# Patient Record
Sex: Female | Born: 1985 | Race: White | Hispanic: No | Marital: Single | Smoking: Current every day smoker
Health system: Southern US, Community
[De-identification: ages and names within clinical notes are randomized; demographics above are authoritative.]

## PROBLEM LIST (undated history)

## (undated) DIAGNOSIS — F319 Bipolar disorder, unspecified: Secondary | ICD-10-CM

## (undated) DIAGNOSIS — F1910 Other psychoactive substance abuse, uncomplicated: Secondary | ICD-10-CM

## (undated) DIAGNOSIS — Z3490 Encounter for supervision of normal pregnancy, unspecified, unspecified trimester: Secondary | ICD-10-CM

## (undated) DIAGNOSIS — F329 Major depressive disorder, single episode, unspecified: Secondary | ICD-10-CM

## (undated) DIAGNOSIS — F419 Anxiety disorder, unspecified: Secondary | ICD-10-CM

## (undated) DIAGNOSIS — F32A Depression, unspecified: Secondary | ICD-10-CM

---
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
## 2007-04-02 ENCOUNTER — Emergency Department: Payer: Self-pay | Admitting: Emergency Medicine

## 2008-01-09 ENCOUNTER — Emergency Department (HOSPITAL_COMMUNITY): Admission: EM | Admit: 2008-01-09 | Discharge: 2008-01-09 | Payer: Self-pay | Admitting: Emergency Medicine

## 2009-02-23 ENCOUNTER — Emergency Department: Payer: Self-pay | Admitting: Emergency Medicine

## 2009-05-09 ENCOUNTER — Emergency Department: Payer: Self-pay | Admitting: Emergency Medicine

## 2009-05-12 ENCOUNTER — Emergency Department: Payer: Self-pay | Admitting: Emergency Medicine

## 2009-10-14 ENCOUNTER — Emergency Department: Payer: Self-pay | Admitting: Internal Medicine

## 2009-11-09 ENCOUNTER — Emergency Department: Payer: Self-pay | Admitting: Emergency Medicine

## 2009-11-23 ENCOUNTER — Emergency Department: Payer: Self-pay | Admitting: Emergency Medicine

## 2009-12-12 ENCOUNTER — Emergency Department: Payer: Self-pay | Admitting: Emergency Medicine

## 2009-12-22 ENCOUNTER — Emergency Department: Payer: Self-pay | Admitting: Emergency Medicine

## 2010-04-08 ENCOUNTER — Observation Stay: Payer: Self-pay | Admitting: Obstetrics and Gynecology

## 2010-04-30 ENCOUNTER — Observation Stay: Payer: Self-pay | Admitting: Obstetrics and Gynecology

## 2010-05-19 ENCOUNTER — Observation Stay: Payer: Self-pay | Admitting: Advanced Practice Midwife

## 2010-06-17 ENCOUNTER — Inpatient Hospital Stay: Payer: Self-pay

## 2011-01-17 LAB — POCT PREGNANCY, URINE: Preg Test, Ur: NEGATIVE

## 2012-01-23 ENCOUNTER — Inpatient Hospital Stay: Payer: Self-pay | Admitting: Obstetrics and Gynecology

## 2012-01-23 LAB — CBC
MCV: 91 fL (ref 80–100)
Platelet: 163 10*3/uL (ref 150–440)
RDW: 11.6 % (ref 11.5–14.5)
WBC: 8 10*3/uL (ref 3.6–11.0)

## 2012-01-23 LAB — URINALYSIS, COMPLETE
Blood: NEGATIVE
Ketone: NEGATIVE
Ph: 6 (ref 4.5–8.0)
Protein: NEGATIVE
Specific Gravity: 1.02 (ref 1.003–1.030)
Squamous Epithelial: 1

## 2012-01-23 LAB — HCG, QUANTITATIVE, PREGNANCY
Beta Hcg, Quant.: 1592 m[IU]/mL — ABNORMAL HIGH
Beta Hcg, Quant.: 1821 m[IU]/mL — ABNORMAL HIGH

## 2012-01-23 LAB — HEMATOCRIT: HCT: 31.9 % — ABNORMAL LOW (ref 35.0–47.0)

## 2012-01-23 LAB — HEMOGLOBIN
HGB: 12 g/dL (ref 12.0–16.0)
HGB: 11.3 g/dL — ABNORMAL LOW (ref 12.0–16.0)
HGB: 11.9 g/dL — ABNORMAL LOW (ref 12.0–16.0)

## 2013-02-21 ENCOUNTER — Encounter (HOSPITAL_COMMUNITY): Payer: Self-pay | Admitting: Emergency Medicine

## 2013-02-21 ENCOUNTER — Emergency Department (HOSPITAL_COMMUNITY)
Admission: EM | Admit: 2013-02-21 | Discharge: 2013-02-22 | Disposition: A | Discharge status: Home / Self Care | Payer: Medicaid Other | Attending: Emergency Medicine | Admitting: Emergency Medicine

## 2013-02-21 DIAGNOSIS — R1084 Generalized abdominal pain: Secondary | ICD-10-CM | POA: Insufficient documentation

## 2013-02-21 DIAGNOSIS — O99330 Smoking (tobacco) complicating pregnancy, unspecified trimester: Secondary | ICD-10-CM | POA: Insufficient documentation

## 2013-02-21 DIAGNOSIS — O219 Vomiting of pregnancy, unspecified: Secondary | ICD-10-CM | POA: Insufficient documentation

## 2013-02-21 DIAGNOSIS — R1032 Left lower quadrant pain: Secondary | ICD-10-CM | POA: Insufficient documentation

## 2013-02-21 DIAGNOSIS — O2390 Unspecified genitourinary tract infection in pregnancy, unspecified trimester: Secondary | ICD-10-CM | POA: Insufficient documentation

## 2013-02-21 DIAGNOSIS — O2342 Unspecified infection of urinary tract in pregnancy, second trimester: Secondary | ICD-10-CM

## 2013-02-21 DIAGNOSIS — R1013 Epigastric pain: Secondary | ICD-10-CM | POA: Insufficient documentation

## 2013-02-21 DIAGNOSIS — R1031 Right lower quadrant pain: Secondary | ICD-10-CM | POA: Insufficient documentation

## 2013-02-21 DIAGNOSIS — N390 Urinary tract infection, site not specified: Secondary | ICD-10-CM | POA: Insufficient documentation

## 2013-02-21 HISTORY — DX: Encounter for supervision of normal pregnancy, unspecified, unspecified trimester: Z34.90

## 2013-02-21 MED ORDER — ONDANSETRON HCL 4 MG/2ML IJ SOLN
4.0000 mg | Freq: Once | INTRAMUSCULAR | Status: AC
  Administered 2013-02-21: 4 mg via INTRAVENOUS
  Filled 2013-02-21: qty 2

## 2013-02-21 MED ORDER — SODIUM CHLORIDE 0.9 % IV SOLN
INTRAVENOUS | Status: AC
  Administered 2013-02-21: via INTRAVENOUS

## 2013-02-21 NOTE — ED Provider Notes (Signed)
CSN: [id number]     Arrival date & time 02/21/13  2230 History   First MD Initiated Contact with Patient 02/21/13 2243     Chief Complaint  Patient presents with  . Abdominal Pain   (Consider location/radiation/quality/duration/timing/severity/associated sxs/prior Treatment) Patient is a 27 y.o. female presenting with abdominal pain. The history is provided by the patient.  Abdominal Pain Pain location:  Generalized Pain quality: cramping   Pain radiates to:  Does not radiate Pain severity:  Severe Onset quality:  Gradual Duration:  2 days Timing:  Intermittent Progression:  Worsening Chronicity:  Recurrent Context: not alcohol use, not diet changes, not medication withdrawal, not previous surgeries, not recent illness, not recent travel, not sick contacts and not suspicious food intake   Relieved by:  Nothing Worsened by:  Nothing tried Ineffective treatments:  NSAIDs Associated symptoms: chills, nausea and vomiting   Associated symptoms: no anorexia, no chest pain, no cough, no diarrhea, no dysuria, no fever, no shortness of breath, no sore throat, no vaginal bleeding and no vaginal discharge   Risk factors: pregnancy    Frances Le is a 27 y.o. G4 P2 SAB x 1 @ 14 weeks 6 days gestation who presents to the ED with nausea and vomiting that started yesterday. She has had nausea and vomiting since early pregnancy but worse today. She has not started prenatal care. She went to the health department last week and based on her period gave her an EDC of 08/16/2013.   Past Medical History  Diagnosis Date  . Pregnant    History reviewed. No pertinent past surgical history. History reviewed. No pertinent family history. History  Substance Use Topics  . Smoking status: Current Every Day Smoker  . Smokeless tobacco: Not on file  . Alcohol Use: No   OB History   Grav Para Term Preterm Abortions TAB SAB Ect Mult Living   1              Review of Systems  Constitutional: Positive  for chills. Negative for fever.  HENT: Negative for congestion and sore throat.   Eyes: Negative for pain.  Respiratory: Negative for cough and shortness of breath.   Cardiovascular: Negative for chest pain.  Gastrointestinal: Positive for nausea, vomiting and abdominal pain. Negative for diarrhea and anorexia.  Genitourinary: Positive for frequency. Negative for dysuria, vaginal bleeding and vaginal discharge.  Musculoskeletal: Negative for back pain.  Skin: Negative for rash.  Neurological: Negative for dizziness and headaches.  Psychiatric/Behavioral: The patient is not nervous/anxious.     Allergies  Review of patient's allergies indicates no known allergies.  Home Medications  No current outpatient prescriptions on file. BP 114/62  Pulse 77  Temp(Src) 98.2 F (36.8 C) (Oral)  Resp 20  Ht 5\' 2"  (1.575 m)  Wt 115 lb (52.164 kg)  BMI 21.03 kg/m2  SpO2 100%  LMP 11/11/2012 Physical Exam  Nursing note and vitals reviewed. Constitutional: She is oriented to person, place, and time. She appears well-developed and well-nourished.  HENT:  Head: Normocephalic and atraumatic.  Eyes: Conjunctivae and EOM are normal.  Neck: Normal range of motion. Neck supple.  Cardiovascular: Normal rate, regular rhythm and normal heart sounds.   Pulmonary/Chest: Effort normal and breath sounds normal.  Abdominal: Soft. Bowel sounds are normal. There is tenderness in the right lower quadrant, epigastric area and left lower quadrant. There is no CVA tenderness.  FHT 140's  Genitourinary:  External genitalia without lesions. White discharge vaginal vault. No CMT, no adnexal  tenderness. Uterus enlarged approximately 14 week size.   Musculoskeletal: Normal range of motion.  Neurological: She is alert and oriented to person, place, and time. No cranial nerve deficit.  Skin: Skin is warm and dry.  Psychiatric: She has a normal mood and affect. Her behavior is normal.   Results for orders placed  during the hospital encounter of 02/21/13 (from the past 24 hour(s))  URINALYSIS, ROUTINE W REFLEX MICROSCOPIC     Status: Abnormal   Collection Time    02/21/13 12:55 AM      Result Value Range   Color, Urine YELLOW  YELLOW   APPearance HAZY (*) CLEAR   Specific Gravity, Urine 1.015  1.005 - 1.030   pH 7.0  5.0 - 8.0   Glucose, UA NEGATIVE  NEGATIVE mg/dL   Hgb urine dipstick NEGATIVE  NEGATIVE   Bilirubin Urine NEGATIVE  NEGATIVE   Ketones, ur NEGATIVE  NEGATIVE mg/dL   Protein, ur NEGATIVE  NEGATIVE mg/dL   Urobilinogen, UA 2.0 (*) 0.0 - 1.0 mg/dL   Nitrite NEGATIVE  NEGATIVE   Leukocytes, UA MODERATE (*) NEGATIVE  WET PREP, GENITAL     Status: Abnormal   Collection Time    02/21/13 12:55 AM      Result Value Range   Yeast Wet Prep HPF POC NONE SEEN  NONE SEEN   Trich, Wet Prep NONE SEEN  NONE SEEN   Clue Cells Wet Prep HPF POC NONE SEEN  NONE SEEN   WBC, Wet Prep HPF POC MANY (*) NONE SEEN  URINE MICROSCOPIC-ADD ON     Status: Abnormal   Collection Time    02/21/13 12:55 AM      Result Value Range   Squamous Epithelial / LPF MANY (*) RARE   WBC, UA 3-6  <3 WBC/hpf   Bacteria, UA MANY (*) RARE   Urine-Other AMORPHOUS URATES/PHOSPHATES    CBC WITH DIFFERENTIAL     Status: Abnormal   Collection Time    02/21/13 11:46 PM      Result Value Range   WBC 8.0  4.0 - 10.5 K/uL   RBC 3.65 (*) 3.87 - 5.11 MIL/uL   Hemoglobin 11.4 (*) 12.0 - 15.0 g/dL   HCT 16.1 (*) 09.6 - 04.5 %   MCV 91.2  78.0 - 100.0 fL   MCH 31.2  26.0 - 34.0 pg   MCHC 34.2  30.0 - 36.0 g/dL   RDW 40.9  81.1 - 91.4 %   Platelets 158  150 - 400 K/uL   Neutrophils Relative % 66  43 - 77 %   Neutro Abs 5.3  1.7 - 7.7 K/uL   Lymphocytes Relative 23  12 - 46 %   Lymphs Abs 1.8  0.7 - 4.0 K/uL   Monocytes Relative 10  3 - 12 %   Monocytes Absolute 0.8  0.1 - 1.0 K/uL   Eosinophils Relative 1  0 - 5 %   Eosinophils Absolute 0.1  0.0 - 0.7 K/uL   Basophils Relative 0  0 - 1 %   Basophils Absolute 0.0  0.0 -  0.1 K/uL    ED Course  Procedures   MDM  27 y.o. female with nausea and vomiting in pregnancy. Abdominal pain in pregnancy. Will treat nausea and vomiting and start antibiotics for early UTI while urine culture pending. Patient stable for discharge without any immediate complications. Vital signs BP 114/62  Pulse 77  Temp(Src) 98.2 F (36.8 C) (Oral)  Resp 20  Ht 5\' 2"  (1.575 m)  Wt 115 lb (52.164 kg)  BMI 21.03 kg/m2  SpO2 100%  LMP 11/11/2012  Discussed with the patient findings and plan of care and all questioned fully answered. She return if any problems arise.    Medication List         cephALEXin 250 MG capsule  Commonly known as:  KEFLEX  Take 1 capsule (250 mg total) by mouth 4 (four) times daily.     promethazine 12.5 MG tablet  Commonly known as:  PHENERGAN  Take 1 tablet (12.5 mg total) by mouth every 6 (six) hours as needed for nausea or vomiting.         [address] Orlene Och, [place] 02/22/13 [phone]

## 2013-02-21 NOTE — ED Notes (Addendum)
abd pain, nausea. No vomiting or diarrhea.  Has been using IV heroin.  And getting suboxone from "friend"  Says she is [redacted] weeks pregnant.

## 2013-02-22 LAB — URINALYSIS, ROUTINE W REFLEX MICROSCOPIC
Nitrite: NEGATIVE
Specific Gravity, Urine: 1.015 (ref 1.005–1.030)
Urobilinogen, UA: 2 mg/dL — ABNORMAL HIGH (ref 0.0–1.0)
pH: 7 (ref 5.0–8.0)

## 2013-02-22 LAB — CBC WITH DIFFERENTIAL/PLATELET
Basophils Relative: 0 % (ref 0–1)
Eosinophils Relative: 1 % (ref 0–5)
HCT: 33.3 % — ABNORMAL LOW (ref 36.0–46.0)
Hemoglobin: 11.4 g/dL — ABNORMAL LOW (ref 12.0–15.0)
MCHC: 34.2 g/dL (ref 30.0–36.0)
MCV: 91.2 fL (ref 78.0–100.0)
Monocytes Absolute: 0.8 10*3/uL (ref 0.1–1.0)
Monocytes Relative: 10 % (ref 3–12)
Neutro Abs: 5.3 10*3/uL (ref 1.7–7.7)

## 2013-02-22 LAB — URINE MICROSCOPIC-ADD ON

## 2013-02-22 LAB — WET PREP, GENITAL: Yeast Wet Prep HPF POC: NONE SEEN

## 2013-02-22 MED ORDER — PROMETHAZINE HCL 12.5 MG PO TABS: 12.5000 mg | ORAL_TABLET | Freq: Four times a day (QID) | ORAL | Status: DC | PRN

## 2013-02-22 MED ORDER — CEPHALEXIN 250 MG PO CAPS: 250.0000 mg | ORAL_CAPSULE | Freq: Four times a day (QID) | ORAL | Status: DC

## 2013-02-22 NOTE — ED Provider Notes (Signed)
Medical screening examination/treatment/procedure(s) were performed by non-physician practitioner and as supervising physician I was immediately available for consultation/collaboration.   Jannell Franta, MD 02/22/13 0645 

## 2013-02-23 LAB — URINE CULTURE
Colony Count: NO GROWTH
Culture: NO GROWTH

## 2013-02-23 LAB — GC/CHLAMYDIA PROBE AMP
CT Probe RNA: NEGATIVE
GC Probe RNA: NEGATIVE

## 2013-04-25 ENCOUNTER — Encounter: Payer: Self-pay | Admitting: Obstetrics and Gynecology

## 2013-06-12 ENCOUNTER — Encounter
Admit: 2013-06-12 | Disposition: A | Discharge status: Home / Self Care | Payer: Self-pay | Admitting: Maternal and Fetal Medicine

## 2013-08-02 ENCOUNTER — Observation Stay: Payer: Self-pay | Admitting: Obstetrics & Gynecology

## 2013-08-02 ENCOUNTER — Emergency Department: Payer: Self-pay | Admitting: Emergency Medicine

## 2013-08-02 LAB — DRUG SCREEN, URINE
AMPHETAMINES, UR SCREEN: NEGATIVE (ref ?–1000)
BARBITURATES, UR SCREEN: NEGATIVE (ref ?–200)
BENZODIAZEPINE, UR SCRN: NEGATIVE (ref ?–200)
COCAINE METABOLITE, UR ~~LOC~~: NEGATIVE (ref ?–300)
Cannabinoid 50 Ng, Ur ~~LOC~~: NEGATIVE (ref ?–50)
MDMA (Ecstasy)Ur Screen: NEGATIVE (ref ?–500)
METHADONE, UR SCREEN: NEGATIVE (ref ?–300)
OPIATE, UR SCREEN: POSITIVE (ref ?–300)
Phencyclidine (PCP) Ur S: NEGATIVE (ref ?–25)
Tricyclic, Ur Screen: NEGATIVE (ref ?–1000)

## 2014-02-17 ENCOUNTER — Encounter (HOSPITAL_COMMUNITY): Payer: Self-pay | Admitting: Emergency Medicine

## 2014-05-05 ENCOUNTER — Emergency Department (HOSPITAL_COMMUNITY): Payer: Medicaid Other

## 2014-05-05 ENCOUNTER — Emergency Department (HOSPITAL_COMMUNITY)
Admission: EM | Admit: 2014-05-05 | Discharge: 2014-05-05 | Disposition: A | Discharge status: Home / Self Care | Payer: Medicaid Other | Attending: Emergency Medicine | Admitting: Emergency Medicine

## 2014-05-05 ENCOUNTER — Encounter (HOSPITAL_COMMUNITY): Payer: Self-pay

## 2014-05-05 DIAGNOSIS — S92902A Unspecified fracture of left foot, initial encounter for closed fracture: Secondary | ICD-10-CM | POA: Insufficient documentation

## 2014-05-05 DIAGNOSIS — Y9389 Activity, other specified: Secondary | ICD-10-CM | POA: Diagnosis not present

## 2014-05-05 DIAGNOSIS — Y92410 Unspecified street and highway as the place of occurrence of the external cause: Secondary | ICD-10-CM | POA: Diagnosis not present

## 2014-05-05 DIAGNOSIS — Z3A22 22 weeks gestation of pregnancy: Secondary | ICD-10-CM | POA: Diagnosis not present

## 2014-05-05 DIAGNOSIS — Y998 Other external cause status: Secondary | ICD-10-CM | POA: Diagnosis not present

## 2014-05-05 DIAGNOSIS — O99332 Smoking (tobacco) complicating pregnancy, second trimester: Secondary | ICD-10-CM | POA: Diagnosis not present

## 2014-05-05 DIAGNOSIS — Z79899 Other long term (current) drug therapy: Secondary | ICD-10-CM | POA: Insufficient documentation

## 2014-05-05 DIAGNOSIS — O9A212 Injury, poisoning and certain other consequences of external causes complicating pregnancy, second trimester: Secondary | ICD-10-CM | POA: Insufficient documentation

## 2014-05-05 DIAGNOSIS — F17210 Nicotine dependence, cigarettes, uncomplicated: Secondary | ICD-10-CM | POA: Diagnosis not present

## 2014-05-05 MED ORDER — ACETAMINOPHEN 325 MG PO TABS
650.0000 mg | ORAL_TABLET | Freq: Once | ORAL | Status: AC
  Administered 2014-05-05: 650 mg via ORAL
  Filled 2014-05-05: qty 2

## 2014-05-05 NOTE — ED Notes (Signed)
C-collar removed by Dr. Lockwood 
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
## 2014-05-05 NOTE — Discharge Instructions (Signed)
As discussed, it is very important that you follow-up with our orthopedist tomorrow via telephone to arrange appropriate ongoing care for your fractures.  Please keep the foot elevated, monitor your condition carefully, and do not hesitate to return for concerning changes in your condition.
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
## 2014-06-26 ENCOUNTER — Emergency Department: Payer: Self-pay | Admitting: Emergency Medicine

## 2014-07-10 ENCOUNTER — Inpatient Hospital Stay: Payer: Self-pay | Admitting: Obstetrics and Gynecology

## 2014-07-10 LAB — DRUG SCREEN, URINE
AMPHETAMINES, UR SCREEN: NEGATIVE (ref ?–1000)
BENZODIAZEPINE, UR SCRN: NEGATIVE (ref ?–200)
Barbiturates, Ur Screen: NEGATIVE (ref ?–200)
COCAINE METABOLITE, UR ~~LOC~~: POSITIVE (ref ?–300)
Cannabinoid 50 Ng, Ur ~~LOC~~: NEGATIVE (ref ?–50)
MDMA (ECSTASY) UR SCREEN: NEGATIVE (ref ?–500)
Methadone, Ur Screen: NEGATIVE (ref ?–300)
Opiate, Ur Screen: POSITIVE (ref ?–300)
PHENCYCLIDINE (PCP) UR S: NEGATIVE (ref ?–25)
Tricyclic, Ur Screen: NEGATIVE (ref ?–1000)

## 2014-07-10 LAB — CBC WITH DIFFERENTIAL/PLATELET
Basophil #: 0.1 10*3/uL (ref 0.0–0.1)
Basophil %: 0.4 %
Eosinophil #: 0.1 10*3/uL (ref 0.0–0.7)
Eosinophil %: 0.9 %
HCT: 29.2 % — ABNORMAL LOW (ref 35.0–47.0)
HGB: 9.7 g/dL — ABNORMAL LOW (ref 12.0–16.0)
Lymphocyte #: 2.7 10*3/uL (ref 1.0–3.6)
Lymphocyte %: 17.2 %
MCH: 30.9 pg (ref 26.0–34.0)
MCHC: 33.3 g/dL (ref 32.0–36.0)
MCV: 93 fL (ref 80–100)
MONO ABS: 1.2 x10 3/mm — AB (ref 0.2–0.9)
Monocyte %: 7.9 %
NEUTROS PCT: 73.6 %
Neutrophil #: 11.5 10*3/uL — ABNORMAL HIGH (ref 1.4–6.5)
PLATELETS: 161 10*3/uL (ref 150–440)
RBC: 3.15 10*6/uL — AB (ref 3.80–5.20)
RDW: 13.1 % (ref 11.5–14.5)
WBC: 15.6 10*3/uL — ABNORMAL HIGH (ref 3.6–11.0)

## 2014-07-10 LAB — RAPID HIV SCREEN (HIV 1/2 AB+AG)

## 2014-07-10 LAB — APTT: Activated PTT: 28.6 secs (ref 23.6–35.9)

## 2014-07-10 LAB — PROTIME-INR
INR: 1
PROTHROMBIN TIME: 13.7 s

## 2014-07-10 LAB — GC/CHLAMYDIA PROBE AMP

## 2014-07-10 LAB — FIBRINOGEN: Fibrinogen: 290 mg/dL (ref 210–470)

## 2014-07-10 LAB — RUPTURE OF MEMBRANE PLUS: Rom Plus: DETECTED

## 2014-07-11 LAB — COMPREHENSIVE METABOLIC PANEL
AST: 54 U/L — AB
Albumin: 2.8 g/dL — ABNORMAL LOW
Alkaline Phosphatase: 136 U/L — ABNORMAL HIGH
Anion Gap: 5 — ABNORMAL LOW (ref 7–16)
BUN: <5 mg/dL
Bilirubin,Total: 0.3 mg/dL
CALCIUM: 8 mg/dL — AB
CHLORIDE: 110 mmol/L
CO2: 23 mmol/L
Creatinine: 0.54 mg/dL
EGFR (Non-African Amer.): >60
Glucose: 171 mg/dL — ABNORMAL HIGH
Potassium: 3.2 mmol/L — ABNORMAL LOW
SGPT (ALT): 43 U/L
Sodium: 138 mmol/L
Total Protein: 6.5 g/dL

## 2014-07-11 LAB — CBC WITH DIFFERENTIAL/PLATELET
BASOS ABS: 0 10*3/uL (ref 0.0–0.1)
Basophil %: 0.1 %
EOS ABS: 0 10*3/uL (ref 0.0–0.7)
Eosinophil %: 0.1 %
HCT: 28.9 % — ABNORMAL LOW (ref 35.0–47.0)
HGB: 9.4 g/dL — ABNORMAL LOW (ref 12.0–16.0)
Lymphocyte #: 1.9 10*3/uL (ref 1.0–3.6)
Lymphocyte %: 10.8 %
MCH: 30.9 pg (ref 26.0–34.0)
MCHC: 32.4 g/dL (ref 32.0–36.0)
MCV: 95 fL (ref 80–100)
Monocyte #: 0.5 x10 3/mm (ref 0.2–0.9)
Monocyte %: 2.7 %
NEUTROS PCT: 86.3 %
Neutrophil #: 15.2 10*3/uL — ABNORMAL HIGH (ref 1.4–6.5)
Platelet: 163 10*3/uL (ref 150–440)
RBC: 3.03 10*6/uL — ABNORMAL LOW (ref 3.80–5.20)
RDW: 13.1 % (ref 11.5–14.5)
WBC: 17.6 10*3/uL — AB (ref 3.6–11.0)

## 2014-07-11 LAB — BETA STREP CULTURE(ARMC)

## 2014-07-29 ENCOUNTER — Inpatient Hospital Stay
Admit: 2014-07-29 | Disposition: A | Discharge status: Home / Self Care | Payer: Self-pay | Attending: Obstetrics and Gynecology | Admitting: Obstetrics and Gynecology

## 2014-07-29 LAB — CBC WITH DIFFERENTIAL/PLATELET
BASOS PCT: 1.1 %
Basophil #: 0.2 10*3/uL — ABNORMAL HIGH (ref 0.0–0.1)
EOS PCT: 1.1 %
Eosinophil #: 0.2 10*3/uL (ref 0.0–0.7)
HCT: 32 % — AB (ref 35.0–47.0)
HGB: 10.4 g/dL — ABNORMAL LOW (ref 12.0–16.0)
LYMPHS ABS: 2.7 10*3/uL (ref 1.0–3.6)
Lymphocyte %: 17.9 %
MCH: 30.4 pg (ref 26.0–34.0)
MCHC: 32.5 g/dL (ref 32.0–36.0)
MCV: 94 fL (ref 80–100)
MONO ABS: 1.1 x10 3/mm — AB (ref 0.2–0.9)
MONOS PCT: 7.2 %
NEUTROS ABS: 11.2 10*3/uL — AB (ref 1.4–6.5)
Neutrophil %: 72.7 %
Platelet: 196 10*3/uL (ref 150–440)
RBC: 3.41 10*6/uL — AB (ref 3.80–5.20)
RDW: 13.4 % (ref 11.5–14.5)
WBC: 15.3 10*3/uL — AB (ref 3.6–11.0)

## 2014-07-29 LAB — DRUG SCREEN, URINE
AMPHETAMINES, UR SCREEN: NEGATIVE
Barbiturates, Ur Screen: NEGATIVE
Benzodiazepine, Ur Scrn: NEGATIVE
CANNABINOID 50 NG, UR ~~LOC~~: NEGATIVE
COCAINE METABOLITE, UR ~~LOC~~: NEGATIVE
MDMA (Ecstasy)Ur Screen: NEGATIVE
Methadone, Ur Screen: NEGATIVE
OPIATE, UR SCREEN: POSITIVE
PHENCYCLIDINE (PCP) UR S: NEGATIVE
Tricyclic, Ur Screen: NEGATIVE

## 2014-07-30 LAB — HEMOGLOBIN: HGB: 10.3 g/dL — AB (ref 12.0–16.0)

## 2014-08-05 NOTE — Consult Note (Signed)
Admit Diagnosis:   ABD PAIN: 23-Jan-2012, Active, ABD PAIN    General Aspect CC: pain in right lower abdomen    Present Illness G3P2, spontaneous vaginal delivery x2, last menstual period 12/01/2011 presents with bleeding x 3 weeks, positive home pregnanct test x 2; onset of pain about 4 hours before arriving at ER. Pain was severe, no radition, no nausea or lightheadedness. Pain peaked on arrival to ED, has since decreased in intensity.  Hcg 2230, u/s no definitive Intrauterine pregnancy, some free fluid which is echogenic, and a right adnexal cystic structure present. Hgb was 14, after 1 liter intravenous fluid trending down. BP low but stable, and pulse is 60-70. Urrine output is adequate     Antibody Screen, Routine, 23-Jan-2012, 1 or more Final Results Received, Standard   Blood Group and Rh, STAT  Draw Date:23-Jan-2012, 23-Jan-2012, 1 or more Final Results Received, Standard   HCG Betasubunit Quant. Serum, STAT  Draw Date:23-Jan-2012, 23-Jan-2012, 1 or more Final Results Received, Standard   Hemogram, Platelet Count, STAT  Draw Date:23-Jan-2012, 23-Jan-2012, 1 or more Final Results Received, Standard   Rh Immune Globulin Notification(Lab Use), STAT, 23-Jan-2012, 1 or more Final Results Received, Standard   Rh Immune Globulin Workup, STAT  Draw Date:23-Jan-2012, 23-Jan-2012, 1 or more Final Results Received, Standard   Rhophylac, Date to be collected:23-Jan-2012, 23-Jan-2012, Completed, Standard   Hematocrit, Timed  Draw Date:23-Jan-2012, 23-Jan-2012, 1 or more Final Results Received, Standard   Hemoglobin, Timed  Draw Date:23-Jan-2012, 23-Jan-2012, 1 or more Final Results Received, Standard   HCG Betasubunit Quant. Serum, STAT  Draw Date:23-Jan-2012, 23-Jan-2012, Specimen Received by Performing Department, Standard   Hematocrit, STAT  Draw Date:23-Jan-2012, 23-Jan-2012, 1 or more Final Results Received, Standard   Hemoglobin, STAT  Draw Date:23-Jan-2012,  23-Jan-2012, 1 or more Final Results Received, Standard   Urinalysis, STAT-Indwelling Catheter  Date to Collect:23-Jan-2012, 23-Jan-2012, 1 or more Final Results Received, Standard   Sodium Chloride 0.9%, 1000 ml at 999 ml/hr, Stop After: 1 Doses, 23-Jan-2012, Completed, Standard   Ondansetron disintegrating tablet,  ( Zofran ODT)  4 mg Oral once  - Indication: Nausea/ Vomiting, 23-Jan-2012, Completed, Standard   MorphINE  injection, 4 mg, IV push, once  Indication: Pain, [Med Admin Window: 30 mins before or after scheduled dose], 23-Jan-2012, Completed, Standard   US OB Less Than 14 Weeks w/ Transvaginal, Stat-STAT-VAG BLEED, PELVIC PAIN, +BHCG CONCERN FOR ECTOPIC  May transport without cardiac monitor**After hours/weekends/holidays - the ordering physician must contact the Radiologist on call to have the study performed, 23-Jan-2012, Interim Results Received, Standard   NPO, 23-Jan-2012, Active, Standard   Foley, Insert prior to proc  For Other Reason: PROCEDURE, 23-Jan-2012, Active, Standard   Document:, urine output post catheter insertion on the I & O flowsheet, 23-Jan-2012, Active, Standard   Administer Rhophylac, 23-Jan-2012, Completed, Standard   Obtain Documentation of Consent for, exploratory laparotomy (surgery to find cause of internal bleeding)  Initiate Procedure/Surgery Confirmation and Validation Form, 23-Jan-2012, Completed, Standard   No Known Allergies:   Case History and Physical Exam:   Chief Complaint Abdominal Pain    Past Medical Health negative    Past Surgical History None    Primary Care Provider [facility]    Family History Non-Contributory    HEENT PERLA    Neck/Nodes Supple    Chest/Lungs Clear    Cardiovascular No Murmurs or Gallops  Normal Sinus Rhythm    Abdomen Benign  Active bowel sounds  no rebound, guarding or distension. minimally tender  Genitalia WNL    Musculoskeletal no c/c/e    Neurological Grossly WNL    Skin Warm   Dry   Review of Systems:   Fever/Chills No    Cough No    Sputum No    Abdominal Pain Yes    Diarrhea No    Constipation Yes    Nausea/Vomiting No    SOB/DOE No    Chest Pain No    Tolerating Diet No   Physical Exam:   PSYCH A+O to time, place, person, comfortable, sleeping during bedside ultrasound    Additional Comments has foley   Rhogam:  07-Oct-13 01:38    Rhogam READY   Rhogam Lot# [id number]   Rhogam Exp. Date 03-23-2014   Notification dawn pulloch   Notification Date 01-23-12   Notification Time 0345 (Result(s) reported on 23 Jan 2012 at 03:52AM.)   Rhig Interpretation INDICATED   Baby's Rh Type not applicable  Result(s) reported on 23 Jan 2012 at 03:50AM.   Mother's Rh Type NEGATIVE  Routine BB:  07-Oct-13 01:38    Antibody Screen NEGATIVE (Result(s) reported on 23 Jan 2012 at 03:40AM.)   ABO Group + Rh Type O Negative  Result(s) reported on 23 Jan 2012 at 02:44AM.  Routine Chem:  07-Oct-13 01:38    HCG Betasubunit Quant. Serum  2275 (1-3  (International Unit)  ----------------- Non-pregnant <5 Weeks Post LMP mIU/mL  3- 4 wk 9 - 130  4- 5 wk 75 - 2,600  5- 6 wk 850 - 20,800  6- 7 wk 4,000 - 100,000  7-12 wk 11,500 - 289,000 12-16 wk 18,000 - 137,000 16-29 wk 1,400 - 53,000 29-41 wk 940 - 60,000)   Result Comment hgb/hct - RESULTS VERIFIED BY REPEAT TESTING.  Result(s) reported on 23 Jan 2012 at 01:55AM.  Routine UA:  07-Oct-13 01:38    Color (UA) Colorless   Clarity (UA) Clear   Glucose (UA) Negative   Bilirubin (UA) Negative   Ketones (UA) Negative   Specific Gravity (UA) 1.020   Blood (UA) Negative   pH (UA) 6.0   Protein (UA) Negative   Nitrite (UA) Negative   Leukocyte Esterase (UA) Negative (Result(s) reported on 23 Jan 2012 at 02:58AM.)   RBC (UA) 3 /HPF   WBC (UA) <1 /HPF   Bacteria (UA) NONE SEEN   Epithelial Cells (UA) 1 /HPF   Mucous (UA) PRESENT (Result(s) reported on 23 Jan 2012 at 02:58AM.)  Routine Hem:   07-Oct-13 01:38    Hemoglobin (CBC) 14.8   Hematocrit (CBC) 40.6   WBC (CBC) 8.0   RBC (CBC) 4.46   Platelet Count (CBC) 163   MCV 91   MCH 33.1   MCHC  36.4   RDW 11.6    06:25    Hemoglobin (CBC) 12.0 (Result(s) reported on 23 Jan 2012 at 06:45AM.)   Hematocrit (CBC)  33.2 (Result(s) reported on 23 Jan 2012 at 06:45AM.)    09:41    Hemoglobin (CBC)  11.3 (Result(s) reported on 23 Jan 2012 at 10:04AM.)   Hematocrit (CBC)  31.9 (Result(s) reported on 23 Jan 2012 at 10:04AM.)   [place]:    07-Oct-13 03:53, US OB Less Than 14 Weeks w/ Transvaginal   US OB Less Than 14 Weeks w/ Transvaginal    PRELIMINARY REPORT    The following is a PRELIMINARY Radiology report.  A final report will follow pending radiologist verification.      REASON FOR EXAM:    VAG BLEED, PELVIC PAIN, +  BHCG CONCERN FOR ECTOPIC  COMMENTS:   May transport without cardiac monitor    PROCEDURE: US  - US OB LESS THAN 14 WEEKS/W TRANS  - Jan 23 2012  3:53AM     RESULT:     Technique: Transabdominal and endovaginal real-time imaging of the pelvis   was obtained. Representative static images were presented for evaluation.    Findings: There is no evidence of an intrauterine gestational sac. The   endometrial thickness is 11.5 mm. The uterus otherwise demonstrates a   homogeneous echotexture.    Within the right adnexal region a hypervascular 1.66 x 1.75 x 1.46 cm     mass is identified. This area is surrounded by intermediate to echogenic   fluid likely representing hemorrhage. The total focus measures 5.52 x   3.87 x 2.69 cm. A moderate amount of debris-filled appearing fluid is   identified within the pelvis. These findings are concerning for a   ruptured ectopic pregnancy considering the patient's elevated beta-hCG.   OB consultation is recommended.    The right ovary is identified measuring 2.48 x 1.99 x 1.53 cm and the   left 2.94 x 1.96 x 2.17 cm. A left ovarian cyst is appreciated measuring   1.79 x  1.03 x 1.59 cm.    IMPRESSION:  Sonographic findings concerning for a ruptured ectopic   pregnancy within the right adnexal region as described above. These   findings were discussed with Dr. Dolores FrameSung of the Emergency Department via   telephone conversation on 01/23/2012 at 4:11 a.m. EDT.  Thank you for the opportunity to contribute to the care of your patient.           Dictated By: Jani FilesHECTOR W. COOPER, M.D., MD     Impression Possible ectopic vs ruptured CL cyst with failing Intrauterine pregnancy. Reports bleeding for three weeks, and last menstual period in August with decreasing pain makes the latter more likely Bedside sonogram showed thick endometrium, small echogenic fluid, small right ovarian cysu, no gest sac    Plan NPO Consent for laparotomy done Continue foley, IVF Serial hgb and hcg Hemodynamically stable and resting comfortably with no pain meds at time of ER eval   Electronic Signatures: Cassell ClementKnowles-Jonas, Siarah Deleo (MD)  (Signed 07-Oct-13 10:30)  Authored: Health Issues, General Aspect/Present Illness, Orders, Allergies, History and Physical Exam, Labs, Radiology, Impression/Plan   Last Updated: 07-Oct-13 10:30 by Cassell ClementKnowles-Jonas, Yandell Mcjunkins (MD)
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
## 2014-08-17 NOTE — Discharge Summary (Signed)
PATIENT NAME:  Frances Le, Frances Le MR#:  [id number] DATE OF BIRTH:  1986-04-02  DATE OF ADMISSION:  07/10/2014 DATE OF DISCHARGE:  07/12/2014  HISTORY OF PRESENT ILLNESS: A 29 year old gravida 5, para 2 1-1-3 at 34 +3 weeks admitted to labor and delivery after presenting with acute onset of vaginal bleeding. The patient was evaluated and was deemed to have a positive ROM plus test consistent with possible rupture of membranes.  A urine drug screen was positive for cocaine. The patient was admitted to labor and delivery for observation.  She is status post prior cesarean section. The patient was receiving [facility] [place].  The patient also admits to tobacco use during pregnancy. The patient is prior heroin addict and is on Suboxone. Over the next 3 days the patient's clinical status was stable. She had no additional significant bleeding. The patient underwent daily non-stress tests which were reactive and daily ultrasounds for amniotic fluid index.  Index remained at 12 cm the day of the admission and 11 cm on 07/11/2014 and on the day of discharge amniotic fluid index increased to 14.7 cm.  Again, a reactive non-stress test on day of discharge. The patient's exam was normal. Normal vital signs and abdomen was soft, nontender. The cervix was closed, 25% effaced, had a pelvis vertex and a small amount of blood on examining glove.  The patient will be discharged to home with strict precautions for additional bleeding or any additional leakage of fluid.  She should return [place] [facility] to her obstetricians if not to [facility].  The patient is re-counseled regarding cocaine use and tobacco use, increasing risk for abruption and fetal loss. The patient expresses understanding to these instructions. The patient will continue on Suboxone that she was previously on before her admission.  She is instructed on daily fetal maternal assessment, i.e., kick counts and  she is to perform those daily. She is instructed if she does not get her fetal kick counts, she should call labor and delivery either at [facility] or at [facility].      ____________________________ Suzy Bouchardhomas J. Arlyn Bumpus, MD tjs:DT D: 07/12/2014 11:06:24 ET T: 07/12/2014 11:37:38 ET JOB#: [id number]  cc: Suzy Bouchardhomas J. Dorothe Elmore, MD, <Dictator> Suzy BouchardHOMAS J Kielan Dreisbach MD ELECTRONICALLY SIGNED 07/16/2014 9:12

## 2014-08-17 NOTE — Op Note (Signed)
PATIENT NAME:  Frances Le, Frances M MR#:  [id number] DATE OF BIRTH:  Jul 28, 1985  DATE OF PROCEDURE:  07/29/2014.  PREOPERATIVE DIAGNOSES:  1. Intrauterine pregnancy at term.  2. Premature rupture of membranes, not in labor.  3. IV drug use during pregnancy.  4. History of prior cesarean section at term, desires trial of labor after cesarean section.  5. History of preterm delivery at 31 weeks.  6. Rh negative  blood type.  7. Tobacco use in pregnancy.  8. Short interpregnancy interval of less than 1 year.   POSTOPERATIVE DIAGNOSES:    1. Intrauterine pregnancy at term.  2. Premature rupture of membranes, not in labor.  3. IV drug use during pregnancy.  4. History of prior cesarean section at term, desires trial of labor after cesarean section.  5. History of preterm delivery at 31 weeks.  6. Rh negative blood type.  7. Tobacco use in pregnancy.  8. Short interpregnancy interval of less than 1 year.  9. Delivery of a viable female infant.     SURGEON:   Christeen DouglasBethany Jenetta Wease.   PROCEDURE:  Normal spontaneous vaginal delivery with repair of a left labial laceration.   ESTIMATED BLOOD LOSS:  200 mL.   ANESTHESIA:  Epidural.   COMPLICATIONS:  None.   FINDINGS:  Viable female infant weighing 4 pounds 12 ounces with Apgars of 8 and 9 at 1 and 5 minutes respectively.  Normal placenta.   INDICATION FOR PROCEDURE:  Frances Le is a 29 year old, gravida 5, para 2-1-1-3,  who presented at 37-1/7 weeks' estimated gestational age with premature rupture of membranes in the setting of desired trial of labor after cesarean section.  She has a pregnancy complicated as above.  She was not contracting on admission.  Pitocin was started.  The patient did receive Suboxone during labor as well as epidural anesthesia.  She progressed easily and rapidly to fully dilated.  Her fluid was clear throughout her labor course, and total time of rupture was approximately 22 hours.   PROCEDURE:  The patient entered  the second stage of labor at which point she began to push easily over an intact perineum.  She delivered a viable female infant in an OA position with no nuchal cord.  The fetal head was delivered followed immediately and easily by the fetal shoulders.  The baby was vigorous and crying prior to cord clamping.  There was a short umbilical cord. The cord was then doubly clamped and cut and the baby passed off to awaiting NICU staff. A three-vessel cord was noted.  Cord blood was collected after delivery of the placenta for an Rh negative mother.   The placenta delivered easily and spontaneously and was intact.  There were no cervical lacerations, no perineal lacerations.  There was a small left labial laceration which was bleeding and which was repaired with 3-0 Vicryl suture in a running fashion.  Fundus was firm.  Bleeding was minimal.  The patient tolerated these procedures well and is stable in recovery.   ____________________________ Cline CoolsBethany E. Lashane Whelpley, MD beb:kc D: 07/29/2014 12:40:00 ET T: 07/29/2014 13:17:48 ET JOB#: [id number]  cc: Cline CoolsBethany E. Jaleigh Mccroskey, MD, <Dictator> Cline CoolsBETHANY E Sriansh Farra MD ELECTRONICALLY SIGNED 08/01/2014 10:43
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
## 2014-08-26 NOTE — H&P (Signed)
L&D Evaluation:  History:  HPI 29 yo [id number] at 34+3 by reported EDC likely early US presenting with vaginal bleeding. She reports feeling "gush" of fluid that was bloody shortly before being brought in by EMS. She reports contractions, abdominal pain, good fetal movement.  She denies drug use this pregnancy and frequent intercourse. She has not been on 17-OHPC reportedly because her last delivery was at term. [facility] [place]. Requesting records currently. Denies complications this pregnancy.   Presents with abdominal pain, contractions   Patient's Medical History Depression, Former Heroin use now on Suboxone   Patient's Surgical History Previous C-Section   Medications Iron  Wellbutrin, Suboxone 8mg  BID   Allergies NKDA   Social History tobacco   Family History Non-Contributory   Exam:  Vital Signs stable   Urine Protein not completed   General no apparent distress   Mental Status clear   Chest clear   Heart normal sinus rhythm   Abdomen gravid, non-tender   Estimated Fetal Weight Average for gestational age   Fetal Position cephalic   Back no CVAT   Pelvic 2/40/-2   Mebranes Intact   FHT normal rate with no decels   Other BSUS: 34+0 2253g 34%ile, normal fluid, active fetus, posterior placenta above the os   Plan:  Comments 1. Vaginal bleeding: Rule out PTL, abruption. Ultrasound shows posterior placenta above the os. Not actively bleeding on cervical exam, but blood obscures nitrazine/ferning eval. Will monitor for progression. Plan for CBC, coags, fibrinogen, UDS, G/C and GBS, IV fluids, BMZ 2. FWB: Cat 1 strip, well-grown.  3. Continue expectant management. Discussed the r/b/a of trial of labor after cesarean. Risks include 3-07/998 risk of uterine rupture with subsequent risk of heavy bleeding, fetal hypoxia and neurologic injury. Risk of emergent cesarean section in the setting of rupture discussed. Risks of  bleeding, infection, damage to surrounding structures discussed.   Electronic Signatures for Addendum Section:  Areta HaberGoodman, Ketra Duchesne M (MD) (Signed Addendum 24-Mar-16 06:55)  G1 2007 34 weeks SVD G2 2011 SAB G3 2012 39 weeks SVD G4 2015 (May) 40 weeks LTCS for breech presentation   Electronic Signatures: Areta HaberGoodman, Benisha Hadaway M (MD)  (Signed 24-Mar-16 01:23)  Authored: L&D Evaluation, Consent   Last Updated: 24-Mar-16 06:55 by Areta HaberGoodman, Bryttney Netzer M (MD)
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
## 2016-05-09 ENCOUNTER — Encounter (HOSPITAL_COMMUNITY): Payer: Self-pay | Admitting: Emergency Medicine

## 2016-05-09 ENCOUNTER — Emergency Department (HOSPITAL_COMMUNITY)
Admission: EM | Admit: 2016-05-09 | Discharge: 2016-05-09 | Disposition: A | Discharge status: Home / Self Care | Payer: Medicaid Other | Attending: Dermatology | Admitting: Dermatology

## 2016-05-09 DIAGNOSIS — Z5321 Procedure and treatment not carried out due to patient leaving prior to being seen by health care provider: Secondary | ICD-10-CM | POA: Insufficient documentation

## 2016-05-09 DIAGNOSIS — R112 Nausea with vomiting, unspecified: Secondary | ICD-10-CM | POA: Insufficient documentation

## 2016-05-09 NOTE — ED Notes (Signed)
Reported that pt left without being seen.  

## 2016-05-09 NOTE — ED Triage Notes (Signed)
Pt c/o n/v/d/muscle cramps/sweating x several hours.

## 2016-06-26 ENCOUNTER — Emergency Department
Admission: EM | Admit: 2016-06-26 | Discharge: 2016-06-27 | Disposition: A | Discharge status: Home / Self Care | Payer: Medicaid Other | Attending: Emergency Medicine | Admitting: Emergency Medicine

## 2016-06-26 ENCOUNTER — Encounter: Payer: Self-pay | Admitting: Emergency Medicine

## 2016-06-26 DIAGNOSIS — Z79899 Other long term (current) drug therapy: Secondary | ICD-10-CM | POA: Diagnosis not present

## 2016-06-26 DIAGNOSIS — E119 Type 2 diabetes mellitus without complications: Secondary | ICD-10-CM | POA: Insufficient documentation

## 2016-06-26 DIAGNOSIS — F419 Anxiety disorder, unspecified: Secondary | ICD-10-CM | POA: Diagnosis not present

## 2016-06-26 DIAGNOSIS — F1910 Other psychoactive substance abuse, uncomplicated: Secondary | ICD-10-CM | POA: Insufficient documentation

## 2016-06-26 DIAGNOSIS — F17210 Nicotine dependence, cigarettes, uncomplicated: Secondary | ICD-10-CM | POA: Insufficient documentation

## 2016-06-26 HISTORY — DX: Depression, unspecified: F32.A

## 2016-06-26 HISTORY — DX: Anxiety disorder, unspecified: F41.9

## 2016-06-26 HISTORY — DX: Major depressive disorder, single episode, unspecified: F32.9

## 2016-06-26 LAB — CBC
HCT: 34.9 % — ABNORMAL LOW (ref 35.0–47.0)
HEMOGLOBIN: 12.1 g/dL (ref 12.0–16.0)
MCH: 30.2 pg (ref 26.0–34.0)
MCHC: 34.7 g/dL (ref 32.0–36.0)
MCV: 87 fL (ref 80.0–100.0)
Platelets: 216 10*3/uL (ref 150–440)
RBC: 4.01 MIL/uL (ref 3.80–5.20)
RDW: 14 % (ref 11.5–14.5)
WBC: 7.5 10*3/uL (ref 3.6–11.0)

## 2016-06-26 NOTE — ED Triage Notes (Signed)
Pt arrives via [facility] with c/o of withdrawal and anxiety related to withdrawal. Pt reports that she has been out of her Suboxine to help with her heroin addiction. Pt denies any recent heroin use and states that the marks on her arms ar from her checking her blood sugar. Pt is moaning and upset in triage but in NAD at this time.

## 2016-06-27 LAB — COMPREHENSIVE METABOLIC PANEL
ALK PHOS: 89 U/L (ref 38–126)
ALT: 99 U/L — AB (ref 14–54)
AST: 115 U/L — ABNORMAL HIGH (ref 15–41)
Albumin: 3.7 g/dL (ref 3.5–5.0)
Anion gap: 5 (ref 5–15)
BILIRUBIN TOTAL: 0.5 mg/dL (ref 0.3–1.2)
BUN: 11 mg/dL (ref 6–20)
CALCIUM: 8.8 mg/dL — AB (ref 8.9–10.3)
CO2: 28 mmol/L (ref 22–32)
CREATININE: 0.65 mg/dL (ref 0.44–1.00)
Chloride: 102 mmol/L (ref 101–111)
GFR calc non Af Amer: >60 mL/min (ref >60)
Glucose, Bld: 82 mg/dL (ref 65–99)
Potassium: 3.9 mmol/L (ref 3.5–5.1)
Sodium: 135 mmol/L (ref 135–145)
Total Protein: 8.1 g/dL (ref 6.5–8.1)

## 2016-06-27 LAB — ETHANOL

## 2016-06-27 LAB — URINE DRUG SCREEN, QUALITATIVE (ARMC ONLY)
Amphetamines, Ur Screen: NOT DETECTED
BARBITURATES, UR SCREEN: NOT DETECTED
Benzodiazepine, Ur Scrn: NOT DETECTED
CANNABINOID 50 NG, UR ~~LOC~~: POSITIVE — AB
Cocaine Metabolite,Ur ~~LOC~~: POSITIVE — AB
MDMA (Ecstasy)Ur Screen: NOT DETECTED
METHADONE SCREEN, URINE: NOT DETECTED
Opiate, Ur Screen: NOT DETECTED
Phencyclidine (PCP) Ur S: NOT DETECTED
TRICYCLIC, UR SCREEN: NOT DETECTED

## 2016-06-27 MED ORDER — ONDANSETRON 4 MG PO TBDP: 4.0000 mg | ORAL_TABLET | Freq: Three times a day (TID) | ORAL | 0 refills | Status: DC | PRN

## 2016-06-27 MED ORDER — LOPERAMIDE HCL 2 MG PO CAPS
4.0000 mg | ORAL_CAPSULE | ORAL | Status: DC | PRN
  Administered 2016-06-27: 4 mg via ORAL
  Filled 2016-06-27: qty 2

## 2016-06-27 MED ORDER — CLONIDINE HCL 0.1 MG PO TABS
0.1000 mg | ORAL_TABLET | Freq: Once | ORAL | Status: AC
  Administered 2016-06-27: 0.1 mg via ORAL
  Filled 2016-06-27: qty 1

## 2016-06-27 MED ORDER — CLONIDINE HCL 0.1 MG PO TABS: 0.1000 mg | ORAL_TABLET | Freq: Two times a day (BID) | ORAL | 0 refills | Status: DC | PRN

## 2016-06-27 MED ORDER — LOPERAMIDE HCL 2 MG PO TABS: 4.0000 mg | ORAL_TABLET | Freq: Four times a day (QID) | ORAL | 0 refills | Status: DC | PRN
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
## 2016-06-27 NOTE — ED Provider Notes (Signed)
[facility] Emergency Department Provider Note  ____________________________________________  Time seen: Approximately 2:23 AM  I have reviewed the triage vital signs and the nursing notes.   HISTORY  Chief Complaint Anxiety and Withdrawal    HPI Frances Le is a 31 y.o. female complains of withdrawal from Suboxone. She last had it 3 weeks ago. She complains of abdominal upset and anxiety and restlessness and difficulty sleeping. She requests something for the withdrawal. She also states she is hungry and wants food. Denies any acute pain. No cough fever vomiting diarrhea vaginal discharge or dysuria. States she can't get heroin anymore because her dealer got arrested.  Last used cocaine 3 days ago, last marijuana within last few days as well.     Past Medical History:  Diagnosis Date  . Anxiety   . Depression   . Diabetes mellitus without complication (HCC)   . Pregnant      There are no active problems to display for this patient.    History reviewed. No pertinent surgical history.   Prior to Admission medications   Medication Sig Start Date End Date Taking? Authorizing Provider  cephALEXin (KEFLEX) 250 MG capsule Take 1 capsule (250 mg total) by mouth 4 (four) times daily. 02/22/13   Hope Orlene Och, NP  cloNIDine (CATAPRES) 0.1 MG tablet Take 1 tablet (0.1 mg total) by mouth 2 (two) times daily as needed. 06/27/16 06/27/17  Sharman Cheek, MD  loperamide (IMODIUM A-D) 2 MG tablet Take 2 tablets (4 mg total) by mouth 4 (four) times daily as needed for diarrhea or loose stools. 06/27/16   Sharman Cheek, MD  ondansetron (ZOFRAN ODT) 4 MG disintegrating tablet Take 1 tablet (4 mg total) by mouth every 8 (eight) hours as needed for nausea or vomiting. 06/27/16   Sharman Cheek, MD  promethazine (PHENERGAN) 12.5 MG tablet Take 1 tablet (12.5 mg total) by mouth every 6 (six) hours as needed for nausea or vomiting. 02/22/13   Hope Orlene Och, NP      Allergies Patient has no known allergies.   No family history on file.  Social History Social History  Substance Use Topics  . Smoking status: Current Every Day Smoker    Packs/day: 1.00    Types: Cigarettes  . Smokeless tobacco: Never Used  . Alcohol use No     Comment: history    Review of Systems  Constitutional:   No fever or chills.  ENT:   No sore throat. No rhinorrhea. Cardiovascular:   No chest pain. Respiratory:   No dyspnea or cough. Gastrointestinal:   Negative for abdominal pain, vomiting and diarrhea.  Genitourinary:   Negative for dysuria or difficulty urinating. Musculoskeletal:   Negative for focal pain or swelling Neurological:   Negative for headaches 10-point ROS otherwise negative.  ____________________________________________   PHYSICAL EXAM:  VITAL SIGNS: ED Triage Vitals  Enc Vitals Group     BP 06/26/16 2330 126/65     Pulse Rate 06/26/16 2330 100     Resp 06/26/16 2330 (!) 21     Temp 06/26/16 2336 98.3 F (36.8 C)     Temp Source 06/26/16 2336 Oral     SpO2 06/26/16 2330 100 %     Weight 06/26/16 2337 115 lb (52.2 kg)     Height 06/26/16 2337 5\' 2"  (1.575 m)     Head Circumference --      Peak Flow --      Pain Score 06/26/16 2337 9  Pain Loc --      Pain Edu? --      Excl. in GC? --     Vital signs reviewed, nursing assessments reviewed.   Constitutional:   Alert and oriented. Well appearing and in no distress.Restlessness Eyes:   No scleral icterus. No conjunctival pallor. PERRL. EOMI.  No nystagmus. ENT   Head:   Normocephalic and atraumatic.   Nose:   No congestion/rhinnorhea. No septal hematoma   Mouth/Throat:   MMM, no pharyngeal erythema. No peritonsillar mass.    Neck:   No stridor. No SubQ emphysema. No meningismus. Hematological/Lymphatic/Immunilogical:   No cervical lymphadenopathy. Cardiovascular:   RRR heart rate 80-90 on monitor. Symmetric bilateral radial and DP pulses.  No murmurs.   Respiratory:   Normal respiratory effort without tachypnea nor retractions. Breath sounds are clear and equal bilaterally. No wheezes/rales/rhonchi. Gastrointestinal:   Soft and nontender. Non distended. There is no CVA tenderness.  No rebound, rigidity, or guarding. Genitourinary:   deferred Musculoskeletal:   Normal range of motion in all extremities. No joint effusions.  No lower extremity tenderness.  No edema. Neurologic:   Normal speech and language.  CN 2-10 normal. Motor grossly intact. No gross focal neurologic deficits are appreciated.  Skin:    Skin is warm, dry and intact. Track marks on forearm. No rash noted.  No petechiae, purpura, or bullae. No piloerection  ____________________________________________    LABS (pertinent positives/negatives) (all labs ordered are listed, but only abnormal results are displayed) Labs Reviewed  CBC - Abnormal; Notable for the following:       Result Value   HCT 34.9 (*)    All other components within normal limits  URINE DRUG SCREEN, QUALITATIVE ([facility] ONLY) - Abnormal; Notable for the following:    Cocaine Metabolite,Ur [name] POSITIVE (*)    Cannabinoid 50 Ng, Ur [name] POSITIVE (*)    All other components within normal limits  COMPREHENSIVE METABOLIC PANEL - Abnormal; Notable for the following:    Calcium 8.8 (*)    AST 115 (*)    ALT 99 (*)    All other components within normal limits  ETHANOL   ____________________________________________   EKG  Interpreted by me Sinus tachycardia rate 101, normal axis intervals QRS ST segments and T waves.  ____________________________________________    RADIOLOGY  No results found.  ____________________________________________   PROCEDURES Procedures  ____________________________________________   INITIAL IMPRESSION / ASSESSMENT AND PLAN / ED COURSE  Pertinent labs & imaging results that were available during my care of the patient were reviewed by me and considered in my  medical decision making (see chart for details).  Patient well appearing no acute distress, presents with vague symptoms, possibly mild opioid withdrawal if the patient has had ongoing abuse of opioids from illicit sources. Labs unremarkable. Mental status intact. No other acute symptoms and no evidence of acute pathology. I'll treat symptomatically with Imodium and clonidine and have her follow-up with [facility]. Review of controlled substance reporting systems shows that she was being seen by a physician in [place] at least once a week for Suboxone management, but last prescription was 06/01/2016. This was for a 5 day supply.       ____________________________________________   FINAL CLINICAL IMPRESSION(S) / ED DIAGNOSES  Final diagnoses:  Anxiety disorder, unspecified type  Polysubstance abuse      New Prescriptions   CLONIDINE (CATAPRES) 0.1 MG TABLET    Take 1 tablet (0.1 mg total) by mouth 2 (two) times daily as needed.  LOPERAMIDE (IMODIUM A-D) 2 MG TABLET    Take 2 tablets (4 mg total) by mouth 4 (four) times daily as needed for diarrhea or loose stools.   ONDANSETRON (ZOFRAN ODT) 4 MG DISINTEGRATING TABLET    Take 1 tablet (4 mg total) by mouth every 8 (eight) hours as needed for nausea or vomiting.     Portions of this note were generated with dragon dictation software. Dictation errors may occur despite best attempts at proofreading.    Sharman CheekPhillip Doralene Glanz, MD 06/27/16 [phone]

## 2016-06-27 NOTE — ED Notes (Signed)
This nurse walked pt. To waiting room.  Pt. Went home with friend who was waiting in waiting room.

## 2017-02-25 ENCOUNTER — Other Ambulatory Visit: Payer: Self-pay

## 2017-02-25 ENCOUNTER — Encounter: Payer: Self-pay | Admitting: *Deleted

## 2017-02-25 ENCOUNTER — Emergency Department
Admission: EM | Admit: 2017-02-25 | Discharge: 2017-02-25 | Disposition: A | Discharge status: Home / Self Care | Payer: Self-pay | Attending: Emergency Medicine | Admitting: Emergency Medicine

## 2017-02-25 DIAGNOSIS — R21 Rash and other nonspecific skin eruption: Secondary | ICD-10-CM | POA: Insufficient documentation

## 2017-02-25 DIAGNOSIS — Z5321 Procedure and treatment not carried out due to patient leaving prior to being seen by health care provider: Secondary | ICD-10-CM | POA: Insufficient documentation

## 2017-02-25 LAB — COMPREHENSIVE METABOLIC PANEL
ALBUMIN: 4.2 g/dL (ref 3.5–5.0)
ALT: 51 U/L (ref 14–54)
ANION GAP: 9 (ref 5–15)
AST: 47 U/L — ABNORMAL HIGH (ref 15–41)
Alkaline Phosphatase: 88 U/L (ref 38–126)
BUN: 9 mg/dL (ref 6–20)
CO2: 23 mmol/L (ref 22–32)
Calcium: 9.2 mg/dL (ref 8.9–10.3)
Chloride: 101 mmol/L (ref 101–111)
Creatinine, Ser: 0.66 mg/dL (ref 0.44–1.00)
GFR calc non Af Amer: >60 mL/min (ref >60)
Glucose, Bld: 115 mg/dL — ABNORMAL HIGH (ref 65–99)
Potassium: 4.1 mmol/L (ref 3.5–5.1)
SODIUM: 133 mmol/L — AB (ref 135–145)
Total Bilirubin: 0.3 mg/dL (ref 0.3–1.2)
Total Protein: 8.8 g/dL — ABNORMAL HIGH (ref 6.5–8.1)

## 2017-02-25 LAB — CBC WITH DIFFERENTIAL/PLATELET
BASOS PCT: 1 %
Basophils Absolute: 0.1 10*3/uL (ref 0–0.1)
EOS PCT: 2 %
Eosinophils Absolute: 0.2 10*3/uL (ref 0–0.7)
HCT: 39.1 % (ref 35.0–47.0)
Hemoglobin: 13.2 g/dL (ref 12.0–16.0)
LYMPHS ABS: 3.1 10*3/uL (ref 1.0–3.6)
Lymphocytes Relative: 32 %
MCH: 29 pg (ref 26.0–34.0)
MCHC: 33.7 g/dL (ref 32.0–36.0)
MCV: 86 fL (ref 80.0–100.0)
Monocytes Absolute: 0.7 10*3/uL (ref 0.2–0.9)
Monocytes Relative: 8 %
Neutro Abs: 5.6 10*3/uL (ref 1.4–6.5)
Neutrophils Relative %: 57 %
Platelets: 183 10*3/uL (ref 150–440)
RBC: 4.55 MIL/uL (ref 3.80–5.20)
RDW: 14.3 % (ref 11.5–14.5)
WBC: 9.7 10*3/uL (ref 3.6–11.0)

## 2017-02-25 LAB — URINALYSIS, COMPLETE (UACMP) WITH MICROSCOPIC
BACTERIA UA: NONE SEEN
BILIRUBIN URINE: NEGATIVE
Glucose, UA: NEGATIVE mg/dL
Ketones, ur: NEGATIVE mg/dL
LEUKOCYTES UA: NEGATIVE
NITRITE: NEGATIVE
PROTEIN: NEGATIVE mg/dL
Specific Gravity, Urine: 1.017 (ref 1.005–1.030)
pH: 5 (ref 5.0–8.0)

## 2017-02-25 LAB — POC URINE PREG, ED: PREG TEST UR: NEGATIVE

## 2017-02-25 LAB — URINE DRUG SCREEN, QUALITATIVE (ARMC ONLY)
AMPHETAMINES, UR SCREEN: NOT DETECTED
Barbiturates, Ur Screen: NOT DETECTED
Benzodiazepine, Ur Scrn: NOT DETECTED
Cannabinoid 50 Ng, Ur ~~LOC~~: NOT DETECTED
Cocaine Metabolite,Ur ~~LOC~~: NOT DETECTED
MDMA (ECSTASY) UR SCREEN: NOT DETECTED
METHADONE SCREEN, URINE: NOT DETECTED
Opiate, Ur Screen: NOT DETECTED
Phencyclidine (PCP) Ur S: NOT DETECTED
Tricyclic, Ur Screen: NOT DETECTED

## 2017-02-25 LAB — ETHANOL: Alcohol, Ethyl (B): <10 mg/dL (ref <10)

## 2017-02-25 NOTE — ED Notes (Signed)
Called x 3 for room include front and bathroom. Patient not in any of those areas.

## 2017-02-25 NOTE — ED Triage Notes (Signed)
Pt to ED reporting she recently left rehab to detox from heroin. Pt reports she took pills today due to the pain she is feeling but denies having taken anything yesterday. ,PT reports her skin on her face and her butt broke out while in rehab. Skin appears to have acne but no signs of irritation at this time. Pt also reports she "pulled a worm out of her right side ribcage yesterday. Nothing seen at sight pt pointed to other than where pt had picked skin off.

## 2017-06-29 ENCOUNTER — Emergency Department
Admission: EM | Admit: 2017-06-29 | Discharge: 2017-06-30 | Disposition: A | Discharge status: Home / Self Care | Payer: Self-pay | Attending: Emergency Medicine | Admitting: Emergency Medicine

## 2017-06-29 ENCOUNTER — Other Ambulatory Visit: Payer: Self-pay

## 2017-06-29 ENCOUNTER — Emergency Department: Payer: Self-pay

## 2017-06-29 ENCOUNTER — Encounter: Payer: Self-pay | Admitting: *Deleted

## 2017-06-29 DIAGNOSIS — F17210 Nicotine dependence, cigarettes, uncomplicated: Secondary | ICD-10-CM | POA: Insufficient documentation

## 2017-06-29 DIAGNOSIS — E119 Type 2 diabetes mellitus without complications: Secondary | ICD-10-CM | POA: Insufficient documentation

## 2017-06-29 DIAGNOSIS — Z79899 Other long term (current) drug therapy: Secondary | ICD-10-CM | POA: Insufficient documentation

## 2017-06-29 DIAGNOSIS — T401X1A Poisoning by heroin, accidental (unintentional), initial encounter: Secondary | ICD-10-CM | POA: Insufficient documentation

## 2017-06-29 NOTE — ED Notes (Signed)
asleep

## 2017-06-29 NOTE — ED Provider Notes (Signed)
[facility] Emergency Department Provider Note  ____________________________________________   First MD Initiated Contact with Patient 06/29/17 2203     (approximate)  I have reviewed the triage vital signs and the nursing notes.   HISTORY  Chief Complaint Drug Overdose    HPI Frances Le is a 32 y.o. female who comes to the emergency department via EMS after an unintentional heroin overdose.  She was discovered by [facility] police after she collapsed on the street and was found to have drug paraphernalia, pinpoint pupils, and not breathing.  [place] police administer 2 mg of intranasal naloxone which nearly immediately reversed the patient's symptoms.  Past Medical History:  Diagnosis Date  . Anxiety   . Depression   . Diabetes mellitus without complication (HCC)   . Pregnant     There are no active problems to display for this patient.   No past surgical history on file.  Prior to Admission medications   Medication Sig Start Date End Date Taking? Authorizing Provider  cephALEXin (KEFLEX) 250 MG capsule Take 1 capsule (250 mg total) by mouth 4 (four) times daily. 02/22/13   Janne Napoleon, NP  cloNIDine (CATAPRES) 0.1 MG tablet Take 1 tablet (0.1 mg total) by mouth 2 (two) times daily as needed. 06/27/16 06/27/17  Sharman Cheek, MD  loperamide (IMODIUM A-D) 2 MG tablet Take 2 tablets (4 mg total) by mouth 4 (four) times daily as needed for diarrhea or loose stools. 06/27/16   Sharman Cheek, MD  ondansetron (ZOFRAN ODT) 4 MG disintegrating tablet Take 1 tablet (4 mg total) by mouth every 8 (eight) hours as needed for nausea or vomiting. 06/27/16   Sharman Cheek, MD  promethazine (PHENERGAN) 12.5 MG tablet Take 1 tablet (12.5 mg total) by mouth every 6 (six) hours as needed for nausea or vomiting. 02/22/13   Janne Napoleon, NP    Allergies Patient has no known allergies.  No family history on file.  Social History Social History    Tobacco Use  . Smoking status: Current Every Day Smoker    Packs/day: 1.00    Types: Cigarettes  . Smokeless tobacco: Never Used  Substance Use Topics  . Alcohol use: No    Comment: history  . Drug use: Yes    Types: IV    Comment: heroin      Review of Systems Constitutional: No fever/chills Eyes: No visual changes. ENT: No sore throat. Cardiovascular: Denies chest pain. Respiratory: Denies shortness of breath. Gastrointestinal: No abdominal pain.  Positive for nausea, no vomiting.  No diarrhea.  No constipation. Genitourinary: Negative for dysuria. Musculoskeletal: Negative for back pain. Skin: Negative for rash. Neurological: Negative for headaches, focal weakness or numbness.   ____________________________________________   PHYSICAL EXAM:  VITAL SIGNS: ED Triage Vitals  Enc Vitals Group     BP      Pulse      Resp      Temp      Temp src      SpO2      Weight      Height      Head Circumference      Peak Flow      Pain Score      Pain Loc      Pain Edu?      Excl. in GC?     Constitutional: Disheveled malodorous calm and cooperative Eyes: PERRL EOMI. Pupils midrange Head: Atraumatic. Nose: No congestion/rhinnorhea. Mouth/Throat: No trismus Neck: No stridor.  Cardiovascular: Normal rate, regular rhythm. Grossly normal heart sounds.  Good peripheral circulation. Respiratory: Normal respiratory effort.  No retractions. Lungs CTAB and moving good air Gastrointestinal: soft nontender Musculoskeletal: No lower extremity edema   Neurologic:  Normal speech and language. No gross focal neurologic deficits are appreciated. Skin:  Trackmarks along both arms Psychiatric: Appears tired    ____________________________________________   DIFFERENTIAL includes but not limited to  Heroin overdose, fentanyl overdose, dehydration, metabolic derangement ____________________________________________   LABS (all labs ordered are listed, but only abnormal  results are displayed)  Labs Reviewed - No data to display   __________________________________________  EKG  ____________________________________________  RADIOLOGY  Chest x-ray reviewed by me with no acute disease ____________________________________________   PROCEDURES  Procedure(s) performed: no  Procedures  Critical Care performed: no  Observation: no ____________________________________________   INITIAL IMPRESSION / ASSESSMENT AND PLAN / ED COURSE  Pertinent labs & imaging results that were available during my care of the patient were reviewed by me and considered in my medical decision making (see chart for details).  The patient arrives pleasant cooperative and overall relatively well-appearing.  She received 2 mg of intranasal naloxone.  Her toxidrome is consistent with opioid.  She denies other drug ingestion.  Will be observed 2 hours.     ----------------------------------------- 12:08 AM on 06/30/2017 -----------------------------------------  The patient was able to eat and drink.  She has been observed more than 2 hours without recurrence of her opioid toxidrome.  At this point she is medically stable for outpatient management.  She is to be discharged in the custody of police that she is being arrested tonight and she is medically stable for booking. ____________________________________________   FINAL CLINICAL IMPRESSION(S) / ED DIAGNOSES  Final diagnoses:  None      NEW MEDICATIONS STARTED DURING THIS VISIT:  New Prescriptions   No medications on file     Note:  This document was prepared using Dragon voice recognition software and may include unintentional dictation errors.     Merrily Brittleifenbark, Loida Calamia, MD 06/30/17 [phone]

## 2017-06-29 NOTE — Discharge Instructions (Signed)
YOU ARE MEDICALLY STABLE FOR BOOKING °

## 2017-06-29 NOTE — ED Triage Notes (Signed)
Per EMS pt overdosed on heroin and was given Narcan by law enforcement. EMS states in arrival pt was alert and cooperative. Pt is no obtunded and lethargic but answers question. [employer] is present

## 2017-06-29 NOTE — ED Notes (Signed)
IV attempt 5 unsuccessful  X 2 RNs and EDP with IJ

## 2017-10-08 ENCOUNTER — Encounter: Payer: Self-pay | Admitting: Emergency Medicine

## 2017-10-08 ENCOUNTER — Emergency Department
Admission: EM | Admit: 2017-10-08 | Discharge: 2017-10-08 | Disposition: A | Discharge status: Home / Self Care | Payer: Medicaid Other | Attending: Emergency Medicine | Admitting: Emergency Medicine

## 2017-10-08 DIAGNOSIS — F17210 Nicotine dependence, cigarettes, uncomplicated: Secondary | ICD-10-CM | POA: Insufficient documentation

## 2017-10-08 DIAGNOSIS — K529 Noninfective gastroenteritis and colitis, unspecified: Secondary | ICD-10-CM | POA: Insufficient documentation

## 2017-10-08 LAB — URINALYSIS, COMPLETE (UACMP) WITH MICROSCOPIC
Bilirubin Urine: NEGATIVE
Glucose, UA: NEGATIVE mg/dL
HGB URINE DIPSTICK: NEGATIVE
Ketones, ur: NEGATIVE mg/dL
Leukocytes, UA: NEGATIVE
Nitrite: NEGATIVE
PROTEIN: NEGATIVE mg/dL
Specific Gravity, Urine: 1.012 (ref 1.005–1.030)
pH: 6 (ref 5.0–8.0)

## 2017-10-08 LAB — COMPREHENSIVE METABOLIC PANEL
ALBUMIN: 3.9 g/dL (ref 3.5–5.0)
ALT: 108 U/L — AB (ref 14–54)
AST: 91 U/L — ABNORMAL HIGH (ref 15–41)
Alkaline Phosphatase: 67 U/L (ref 38–126)
Anion gap: 7 (ref 5–15)
BUN: 13 mg/dL (ref 6–20)
CHLORIDE: 109 mmol/L (ref 101–111)
CO2: 22 mmol/L (ref 22–32)
CREATININE: 0.73 mg/dL (ref 0.44–1.00)
Calcium: 8.8 mg/dL — ABNORMAL LOW (ref 8.9–10.3)
GFR calc non Af Amer: >60 mL/min (ref >60)
Glucose, Bld: 118 mg/dL — ABNORMAL HIGH (ref 65–99)
Potassium: 3.7 mmol/L (ref 3.5–5.1)
SODIUM: 138 mmol/L (ref 135–145)
Total Bilirubin: 0.7 mg/dL (ref 0.3–1.2)
Total Protein: 8.1 g/dL (ref 6.5–8.1)

## 2017-10-08 LAB — CBC
HCT: 39.7 % (ref 35.0–47.0)
HEMOGLOBIN: 13.7 g/dL (ref 12.0–16.0)
MCH: 31.6 pg (ref 26.0–34.0)
MCHC: 34.5 g/dL (ref 32.0–36.0)
MCV: 91.7 fL (ref 80.0–100.0)
PLATELETS: 124 10*3/uL — AB (ref 150–440)
RBC: 4.33 MIL/uL (ref 3.80–5.20)
RDW: 14.4 % (ref 11.5–14.5)
WBC: 8.2 10*3/uL (ref 3.6–11.0)

## 2017-10-08 LAB — LIPASE, BLOOD: Lipase: 25 U/L (ref 11–51)

## 2017-10-08 LAB — POC URINE PREG, ED: PREG TEST UR: NEGATIVE

## 2017-10-08 NOTE — ED Triage Notes (Signed)
Pt comes into the ED via POV c/o emesis, abdominal cramping, diarrhea, and fever that started yesterday.  Patient states her fever was 101 yesterday.  Patient in NAD at this time with even and unlabored respirations.  Patient has had one episode of emesis today and is continuing to have the diarrhea.

## 2017-10-08 NOTE — ED Notes (Signed)
Pt is here r/t to n/d pt states she needs a doctors note. Pt denies n/d at this time.

## 2017-10-08 NOTE — ED Notes (Signed)
Called pt to be taken to treatment room, no answer.

## 2017-10-08 NOTE — ED Provider Notes (Signed)
[facility] Emergency Department Provider Note   ____________________________________________    I have reviewed the triage vital signs and the nursing notes.   HISTORY  Chief Complaint Abdominal Pain; Diarrhea; and Emesis     HPI Frances Le is a 32 y.o. female who presents with complaints of abdominal cramping, nausea vomiting and diarrhea which started yesterday morning.  She reports she is feeling sniffily better today.  Did have an episode of vomiting this morning and diarrhea this morning but none since then.  Denies abdominal pain currently.  Felt feverish yesterday but did not check her temperature.  Was told by her workplace that she would need a doctor's note to return   Past Medical History:  Diagnosis Date  . Anxiety   . Depression   . Pregnant     There are no active problems to display for this patient.   History reviewed. No pertinent surgical history.  Prior to Admission medications   Not on File     Allergies Patient has no known allergies.  No family history on file.  Social History Social History   Tobacco Use  . Smoking status: Current Every Day Smoker    Packs/day: 1.00    Types: Cigarettes  . Smokeless tobacco: Never Used  Substance Use Topics  . Alcohol use: No    Comment: history  . Drug use: Yes    Types: IV    Comment: heroin . clean since March 14th    Review of Systems  Constitutional: No fever/chills Eyes: No visual changes.  ENT: No sore throat. Cardiovascular: Denies chest pain. Respiratory: Denies shortness of breath. Gastrointestinal: As above.   Genitourinary: Negative for dysuria. Musculoskeletal: Negative for back pain. Skin: Negative for rash. Neurological: Negative for headaches    ____________________________________________   PHYSICAL EXAM:  VITAL SIGNS: ED Triage Vitals  Enc Vitals Group     BP 10/08/17 1330 116/70     Pulse Rate 10/08/17 1330 60     Resp --     Temp --      Temp src --      SpO2 10/08/17 1330 98 %     Weight 10/08/17 1208 61.2 kg (135 lb)     Height 10/08/17 1208 1.575 m (5\' 2" )     Head Circumference --      Peak Flow --      Pain Score 10/08/17 1208 7     Pain Loc --      Pain Edu? --      Excl. in GC? --     Constitutional: Alert and oriented. No acute distress. Pleasant and interactive Eyes: Conjunctivae are normal.   Nose: No congestion/rhinnorhea. Mouth/Throat: Mucous membranes are moist.    Cardiovascular: Normal rate, regular rhythm. Grossly normal heart sounds.  Good peripheral circulation. Respiratory: Normal respiratory effort.  No retractions. . Gastrointestinal: Soft and nontender. No distention.   Genitourinary: deferred Musculoskeletal: No lower extremity tenderness nor edema.  Warm and well perfused Neurologic:  Normal speech and language. No gross focal neurologic deficits are appreciated.  Skin:  Skin is warm, dry and intact. No rash noted. Psychiatric: Mood and affect are normal. Speech and behavior are normal.  ____________________________________________   LABS (all labs ordered are listed, but only abnormal results are displayed)  Labs Reviewed  COMPREHENSIVE METABOLIC PANEL - Abnormal; Notable for the following components:      Result Value   Glucose, Bld 118 (*)    Calcium 8.8 (*)  AST 91 (*)    ALT 108 (*)    All other components within normal limits  CBC - Abnormal; Notable for the following components:   Platelets 124 (*)    All other components within normal limits  URINALYSIS, COMPLETE (UACMP) WITH MICROSCOPIC - Abnormal; Notable for the following components:   Color, Urine YELLOW (*)    APPearance CLEAR (*)    Bacteria, UA RARE (*)    All other components within normal limits  LIPASE, BLOOD  POC URINE PREG, ED    ____________________________________________  EKG  None ____________________________________________  RADIOLOGY   ____________________________________________   PROCEDURES  Procedure(s) performed: No  Procedures   Critical Care performed: No ____________________________________________   INITIAL IMPRESSION / ASSESSMENT AND PLAN / ED COURSE  Pertinent labs & imaging results that were available during my care of the patient were reviewed by me and considered in my medical decision making (see chart for details).  Patient well-appearing in no acute distress.  Abdominal exam is quite reassuring.  Lab work significant for elevated LFTs although this appears to be chronic.  Mildly decreased platelets, discussed with patient need for outpatient follow-up.  Appropriate for discharge, suspect viral gastroenteritis    ____________________________________________   FINAL CLINICAL IMPRESSION(S) / ED DIAGNOSES  Final diagnoses:  Gastroenteritis        Note:  This document was prepared using Dragon voice recognition software and may include unintentional dictation errors.    Jene Every, MD 10/08/17 1415

## 2017-11-26 ENCOUNTER — Emergency Department
Admission: EM | Admit: 2017-11-26 | Discharge: 2017-11-26 | Disposition: A | Discharge status: Home / Self Care | Payer: Medicaid Other | Attending: Emergency Medicine | Admitting: Emergency Medicine

## 2017-11-26 ENCOUNTER — Other Ambulatory Visit: Payer: Self-pay

## 2017-11-26 ENCOUNTER — Emergency Department: Payer: Medicaid Other

## 2017-11-26 ENCOUNTER — Encounter: Payer: Self-pay | Admitting: Emergency Medicine

## 2017-11-26 DIAGNOSIS — R1084 Generalized abdominal pain: Secondary | ICD-10-CM | POA: Insufficient documentation

## 2017-11-26 DIAGNOSIS — R112 Nausea with vomiting, unspecified: Secondary | ICD-10-CM | POA: Insufficient documentation

## 2017-11-26 DIAGNOSIS — F17210 Nicotine dependence, cigarettes, uncomplicated: Secondary | ICD-10-CM | POA: Insufficient documentation

## 2017-11-26 LAB — COMPREHENSIVE METABOLIC PANEL
ALT: 84 U/L — ABNORMAL HIGH (ref 0–44)
AST: 70 U/L — ABNORMAL HIGH (ref 15–41)
Albumin: 3.9 g/dL (ref 3.5–5.0)
Alkaline Phosphatase: 66 U/L (ref 38–126)
Anion gap: 5 (ref 5–15)
BILIRUBIN TOTAL: 0.6 mg/dL (ref 0.3–1.2)
BUN: 10 mg/dL (ref 6–20)
CO2: 29 mmol/L (ref 22–32)
Calcium: 8.9 mg/dL (ref 8.9–10.3)
Chloride: 106 mmol/L (ref 98–111)
Creatinine, Ser: 0.74 mg/dL (ref 0.44–1.00)
GFR calc Af Amer: >60 mL/min (ref >60)
GFR calc non Af Amer: >60 mL/min (ref >60)
Glucose, Bld: 98 mg/dL (ref 70–99)
POTASSIUM: 4 mmol/L (ref 3.5–5.1)
Sodium: 140 mmol/L (ref 135–145)
TOTAL PROTEIN: 7.4 g/dL (ref 6.5–8.1)

## 2017-11-26 LAB — CBC
HEMATOCRIT: 42.3 % (ref 35.0–47.0)
Hemoglobin: 14.6 g/dL (ref 12.0–16.0)
MCH: 32.1 pg (ref 26.0–34.0)
MCHC: 34.6 g/dL (ref 32.0–36.0)
MCV: 92.7 fL (ref 80.0–100.0)
Platelets: 120 10*3/uL — ABNORMAL LOW (ref 150–440)
RBC: 4.56 MIL/uL (ref 3.80–5.20)
RDW: 12.6 % (ref 11.5–14.5)
WBC: 5.3 10*3/uL (ref 3.6–11.0)

## 2017-11-26 LAB — LIPASE, BLOOD: Lipase: 24 U/L (ref 11–51)

## 2017-11-26 LAB — HCG, QUANTITATIVE, PREGNANCY

## 2017-11-26 MED ORDER — KETOROLAC TROMETHAMINE 30 MG/ML IJ SOLN
15.0000 mg | Freq: Once | INTRAMUSCULAR | Status: AC
  Administered 2017-11-26: 15 mg via INTRAVENOUS
  Filled 2017-11-26: qty 1

## 2017-11-26 MED ORDER — IOHEXOL 300 MG/ML  SOLN
75.0000 mL | Freq: Once | INTRAMUSCULAR | Status: AC | PRN
  Administered 2017-11-26: 75 mL via INTRAVENOUS

## 2017-11-26 MED ORDER — ONDANSETRON HCL 4 MG/2ML IJ SOLN
4.0000 mg | Freq: Once | INTRAMUSCULAR | Status: AC
  Administered 2017-11-26: 4 mg via INTRAVENOUS
  Filled 2017-11-26: qty 2

## 2017-11-26 MED ORDER — ONDANSETRON 4 MG PO TBDP: 4.0000 mg | ORAL_TABLET | Freq: Three times a day (TID) | ORAL | 0 refills | Status: AC | PRN

## 2017-11-26 MED ORDER — SODIUM CHLORIDE 0.9 % IV BOLUS: 1000.0000 mL | Freq: Once | INTRAVENOUS | Status: DC

## 2017-11-26 NOTE — Discharge Instructions (Addendum)

## 2017-11-26 NOTE — ED Notes (Signed)
.   Pt is resting, Respirations even and unlabored, NAD. Stretcher lowest postion and locked. Call bell within reach. Denies any needs at this time RN will continue to monitor.    

## 2017-11-26 NOTE — ED Provider Notes (Signed)
[facility] Emergency Department Provider Note  ____________________________________________  Time seen: Approximately 4:00 PM  I have reviewed the triage vital signs and the nursing notes.   HISTORY  Chief Complaint Emesis and Abdominal Pain   HPI Frances Le is a 32 y.o. female history of IV drug use, depression, anxiety who presents for evaluation of abdominal pain.  Patient reports 3 days of nausea and several daily episodes of nonbloody nonbilious emesis every time she eats.  She has had mild cramping diffuse abdominal pain with it.  Normal bowel movements with no constipation or diarrhea.  No fever chills, no dysuria or hematuria, no vaginal discharge.  Last menstrual period ended today.  She denies any prior abdominal surgeries other than a C-section.  Patient reports that she has been clean from IV drugs since March.  No alcohol.  No chest pain or shortness of breath.  Past Medical History:  Diagnosis Date  . Anxiety   . Depression   . Pregnant     History reviewed. No pertinent surgical history.  Prior to Admission medications   Medication Sig Start Date End Date Taking? Authorizing Provider  ondansetron (ZOFRAN ODT) 4 MG disintegrating tablet Take 1 tablet (4 mg total) by mouth every 8 (eight) hours as needed for nausea or vomiting. 11/26/17   Nita Sickle, MD    Allergies Patient has no known allergies.  No family history on file.  Social History Social History   Tobacco Use  . Smoking status: Current Every Day Smoker    Packs/day: 1.00    Types: Cigarettes  . Smokeless tobacco: Never Used  Substance Use Topics  . Alcohol use: No    Comment: history  . Drug use: Yes    Types: IV    Comment: heroin . clean since March 14th    Review of Systems  Constitutional: Negative for fever. Eyes: Negative for visual changes. ENT: Negative for sore throat. Neck: No neck pain  Cardiovascular: Negative for chest  pain. Respiratory: Negative for shortness of breath. Gastrointestinal: + diffuse abdominal pain, nausea, and vomiting. No diarrhea Genitourinary: Negative for dysuria. Musculoskeletal: Negative for back pain. Skin: Negative for rash. Neurological: Negative for headaches, weakness or numbness. Psych: No SI or HI  ____________________________________________   PHYSICAL EXAM:  VITAL SIGNS: ED Triage Vitals  Enc Vitals Group     BP 11/26/17 1409 128/71     Pulse Rate 11/26/17 1409 70     Resp 11/26/17 1409 18     Temp 11/26/17 1409 98.1 F (36.7 C)     Temp Source 11/26/17 1409 Oral     SpO2 11/26/17 1409 100 %     Weight 11/26/17 1409 135 lb (61.2 kg)     Height 11/26/17 1409 5\' 2"  (1.575 m)     Head Circumference --      Peak Flow --      Pain Score 11/26/17 1412 8     Pain Loc --      Pain Edu? --      Excl. in GC? --     Constitutional: Alert and oriented. Well appearing and in no apparent distress. HEENT:      Head: Normocephalic and atraumatic.         Eyes: Conjunctivae are normal. Sclera is non-icteric.       Mouth/Throat: Mucous membranes are moist.       Neck: Supple with no signs of meningismus. Cardiovascular: Regular rate and rhythm. No murmurs, gallops, or  rubs. 2+ symmetrical distal pulses are present in all extremities. No JVD. Respiratory: Normal respiratory effort. Lungs are clear to auscultation bilaterally. No wheezes, crackles, or rhonchi.  Gastrointestinal: Soft, mild diffuse abdominal tenderness, and non distended with positive bowel sounds. No rebound or guarding. Musculoskeletal: Nontender with normal range of motion in all extremities. No edema, cyanosis, or erythema of extremities. Neurologic: Normal speech and language. Face is symmetric. Moving all extremities. No gross focal neurologic deficits are appreciated. Skin: Skin is warm, dry and intact. No rash noted. Psychiatric: Mood and affect are normal. Speech and behavior are  normal.  ____________________________________________   LABS (all labs ordered are listed, but only abnormal results are displayed)  Labs Reviewed  COMPREHENSIVE METABOLIC PANEL - Abnormal; Notable for the following components:      Result Value   AST 70 (*)    ALT 84 (*)    All other components within normal limits  CBC - Abnormal; Notable for the following components:   Platelets 120 (*)    All other components within normal limits  LIPASE, BLOOD  HCG, QUANTITATIVE, PREGNANCY  URINALYSIS, COMPLETE (UACMP) WITH MICROSCOPIC   ____________________________________________  EKG  none  ____________________________________________  RADIOLOGY  I have personally reviewed the images performed during this visit and I agree with the Radiologist's read.   Interpretation by Radiologist:  Ct Abdomen Pelvis W Contrast  Result Date: 11/26/2017 CLINICAL DATA:  Lower abdominal pain and vomiting for 3 days. Evaluate for bowel obstruction. EXAM: CT ABDOMEN AND PELVIS WITH CONTRAST TECHNIQUE: Multidetector CT imaging of the abdomen and pelvis was performed using the standard protocol following bolus administration of intravenous contrast. CONTRAST:  75mL OMNIPAQUE IOHEXOL 300 MG/ML  SOLN COMPARISON:  None. FINDINGS: Lower chest: No acute abnormality. Hepatobiliary: No focal liver abnormality is seen. No gallstones, gallbladder wall thickening, or biliary dilatation. Pancreas: Unremarkable. No pancreatic ductal dilatation or surrounding inflammatory changes. Spleen: Normal in size without focal abnormality. Adrenals/Urinary Tract: Adrenal glands are unremarkable. Kidneys are normal, without renal calculi, focal lesion, or hydronephrosis. Bladder is unremarkable. Stomach/Bowel: The stomach appears nondistended. No dilated loops of small bowel identified. No abnormal small bowel wall thickening or inflammation identified. The appendix is visualized and appears normal. Vascular/Lymphatic: Normal  appearance of the abdominal aorta. No abdominopelvic adenopathy. Reproductive: The uterus appears normal. No adnexal mass. Prominent left parametrial veins are identified, image 73/2. Other: No abdominopelvic adenopathy. No hernia identified. Musculoskeletal: No acute or significant osseous findings. IMPRESSION: 1. No evidence for bowel obstruction. 2. Prominent left-sided parametrial veins are identified which may be seen with pelvic venous congestion syndrome. Electronically Signed   By: Signa Kell M.D.   On: 11/26/2017 18:59   Dg Abdomen Acute W/chest  Result Date: 11/26/2017 CLINICAL DATA:  Diffuse abdominal pain. Nausea and vomiting EXAM: DG ABDOMEN ACUTE W/ 1V CHEST COMPARISON:  06/29/2017 FINDINGS: There are several dilated loops of small bowel identified within the left lower quadrant of the abdomen which measure up to 3.1 cm. No air-fluid levels identified. IMPRESSION: Dilated loops of small bowel in the left lower quadrant of the abdomen. Findings may represent early or partial small bowel obstruction versus focal ileus. Electronically Signed   By: Signa Kell M.D.   On: 11/26/2017 17:39     ____________________________________________   PROCEDURES  Procedure(s) performed: None Procedures Critical Care performed:  None ____________________________________________   INITIAL IMPRESSION / ASSESSMENT AND PLAN / ED COURSE  32 y.o. female history of IV drug use, depression, anxiety who presents for evaluation of  N/V and diffuse cramping abdominal pain x 3 days.  Patient is well-appearing, no distress, she has normal vital signs, abdomen is soft with mild diffuse tenderness, no localized tenderness rebound or guarding, positive bowel sounds.  Differential diagnosis including but not limited to gastritis versus gallbladder versus peptic ulcer disease versus SBO versus UTI versus pancreatitis.  Plan for IV fluids, Toradol and Zofran.  Will check labs including CBC, CMP, lipase, pregnancy  test, and urinalysis.  Clinical Course as of Nov 26 1908  Wynelle LinkSun Nov 26, 2017  [id number] Initial KUB concerning for possible SBO therefore patient was sent for a CT abdomen pelvis which is negative for SBO or any other acute intra-abdominal findings.  Patient is tolerating p.o.  And feels markedly improved.  I explained to her that I have been unable to rule out UTI since patient has not provided a urinalysis.  She reports no urinary symptoms and refuses to provide a urine sample at this time.  She is requesting discharge.  I will give her a prescription for Zofran and recommend to follow-up with her primary care doctor.  Discussed return precautions for dysuria, fever, new or worsening abdominal pain.   [CV]    Clinical Course User Index [CV] Don PerkingVeronese, [place], MD     As part of my medical decision making, I reviewed the following data within the electronic MEDICAL RECORD NUMBER Nursing notes reviewed and incorporated, Labs reviewed , Old chart reviewed, Radiograph reviewed , Notes from prior ED visits and [place] Controlled Substance Database    Pertinent labs & imaging results that were available during my care of the patient were reviewed by me and considered in my medical decision making (see chart for details).    ____________________________________________   FINAL CLINICAL IMPRESSION(S) / ED DIAGNOSES  Final diagnoses:  Non-intractable vomiting with nausea, unspecified vomiting type  Generalized abdominal pain      NEW MEDICATIONS STARTED DURING THIS VISIT:  ED Discharge Orders         Ordered    ondansetron (ZOFRAN ODT) 4 MG disintegrating tablet  Every 8 hours PRN     11/26/17 1910           Note:  This document was prepared using Dragon voice recognition software and may include unintentional dictation errors.    Don PerkingVeronese, [place], MD 11/26/17 1910

## 2017-11-26 NOTE — ED Notes (Signed)
US machine set up for EDP.

## 2017-11-26 NOTE — ED Triage Notes (Signed)
Pt to ED via POV c/o lower abdominal pain and vomiting x 3 days. Pt denies diarrhea or fever. Pt is in NAD at this time.

## 2017-12-11 ENCOUNTER — Other Ambulatory Visit: Payer: Self-pay

## 2017-12-11 ENCOUNTER — Emergency Department: Payer: Medicaid Other

## 2017-12-11 ENCOUNTER — Encounter: Payer: Self-pay | Admitting: Emergency Medicine

## 2017-12-11 ENCOUNTER — Emergency Department
Admission: EM | Admit: 2017-12-11 | Discharge: 2017-12-11 | Disposition: A | Discharge status: Home / Self Care | Payer: Medicaid Other | Attending: Emergency Medicine | Admitting: Emergency Medicine

## 2017-12-11 DIAGNOSIS — Z5321 Procedure and treatment not carried out due to patient leaving prior to being seen by health care provider: Secondary | ICD-10-CM | POA: Insufficient documentation

## 2017-12-11 DIAGNOSIS — M25562 Pain in left knee: Secondary | ICD-10-CM | POA: Insufficient documentation

## 2017-12-11 NOTE — ED Notes (Signed)
Explained the wait    

## 2017-12-11 NOTE — ED Notes (Signed)
See triage note  Presents with pain to left knee   States she fell at work on thursday   Left knee swollen and increased pain with ambulation

## 2017-12-11 NOTE — ED Notes (Signed)
Pt not in room for provider   

## 2017-12-11 NOTE — ED Triage Notes (Signed)
Patient reports slipping at work and falling on Thursday, twisting her left knee. Now reporting worsening left knee pain.

## 2017-12-17 ENCOUNTER — Emergency Department: Payer: Medicaid Other

## 2017-12-17 ENCOUNTER — Emergency Department
Admission: EM | Admit: 2017-12-17 | Discharge: 2017-12-17 | Disposition: A | Discharge status: Home / Self Care | Payer: Medicaid Other | Attending: Emergency Medicine | Admitting: Emergency Medicine

## 2017-12-17 ENCOUNTER — Other Ambulatory Visit: Payer: Self-pay

## 2017-12-17 DIAGNOSIS — W010XXA Fall on same level from slipping, tripping and stumbling without subsequent striking against object, initial encounter: Secondary | ICD-10-CM | POA: Insufficient documentation

## 2017-12-17 DIAGNOSIS — Y929 Unspecified place or not applicable: Secondary | ICD-10-CM | POA: Insufficient documentation

## 2017-12-17 DIAGNOSIS — Z79899 Other long term (current) drug therapy: Secondary | ICD-10-CM | POA: Insufficient documentation

## 2017-12-17 DIAGNOSIS — Y939 Activity, unspecified: Secondary | ICD-10-CM | POA: Insufficient documentation

## 2017-12-17 DIAGNOSIS — Y990 Civilian activity done for income or pay: Secondary | ICD-10-CM | POA: Insufficient documentation

## 2017-12-17 DIAGNOSIS — F1910 Other psychoactive substance abuse, uncomplicated: Secondary | ICD-10-CM | POA: Insufficient documentation

## 2017-12-17 DIAGNOSIS — F329 Major depressive disorder, single episode, unspecified: Secondary | ICD-10-CM | POA: Insufficient documentation

## 2017-12-17 DIAGNOSIS — L03114 Cellulitis of left upper limb: Secondary | ICD-10-CM | POA: Insufficient documentation

## 2017-12-17 DIAGNOSIS — S93402A Sprain of unspecified ligament of left ankle, initial encounter: Secondary | ICD-10-CM

## 2017-12-17 DIAGNOSIS — F17210 Nicotine dependence, cigarettes, uncomplicated: Secondary | ICD-10-CM | POA: Insufficient documentation

## 2017-12-17 HISTORY — DX: Other psychoactive substance abuse, uncomplicated: F19.10

## 2017-12-17 LAB — CBC
HEMATOCRIT: 34.5 % — AB (ref 35.0–47.0)
Hemoglobin: 12.1 g/dL (ref 12.0–16.0)
MCH: 31.7 pg (ref 26.0–34.0)
MCHC: 35 g/dL (ref 32.0–36.0)
MCV: 90.4 fL (ref 80.0–100.0)
Platelets: 180 10*3/uL (ref 150–440)
RBC: 3.81 MIL/uL (ref 3.80–5.20)
RDW: 12.4 % (ref 11.5–14.5)
WBC: 11.1 10*3/uL — AB (ref 3.6–11.0)

## 2017-12-17 LAB — COMPREHENSIVE METABOLIC PANEL
ALK PHOS: 55 U/L (ref 38–126)
ALT: 47 U/L — AB (ref 0–44)
AST: 66 U/L — ABNORMAL HIGH (ref 15–41)
Albumin: 3.4 g/dL — ABNORMAL LOW (ref 3.5–5.0)
Anion gap: 12 (ref 5–15)
BUN: 16 mg/dL (ref 6–20)
CO2: 23 mmol/L (ref 22–32)
CREATININE: 0.7 mg/dL (ref 0.44–1.00)
Calcium: 8.4 mg/dL — ABNORMAL LOW (ref 8.9–10.3)
Chloride: 101 mmol/L (ref 98–111)
GFR calc non Af Amer: >60 mL/min (ref >60)
Glucose, Bld: 110 mg/dL — ABNORMAL HIGH (ref 70–99)
Potassium: 3.7 mmol/L (ref 3.5–5.1)
Sodium: 136 mmol/L (ref 135–145)
Total Bilirubin: 0.9 mg/dL (ref 0.3–1.2)
Total Protein: 7.9 g/dL (ref 6.5–8.1)

## 2017-12-17 LAB — URINE DRUG SCREEN, QUALITATIVE (ARMC ONLY)
AMPHETAMINES, UR SCREEN: POSITIVE — AB
Barbiturates, Ur Screen: NOT DETECTED
Cannabinoid 50 Ng, Ur ~~LOC~~: POSITIVE — AB
Cocaine Metabolite,Ur ~~LOC~~: POSITIVE — AB
MDMA (ECSTASY) UR SCREEN: NOT DETECTED
Methadone Scn, Ur: NOT DETECTED
OPIATE, UR SCREEN: POSITIVE — AB
PHENCYCLIDINE (PCP) UR S: NOT DETECTED
Tricyclic, Ur Screen: NOT DETECTED

## 2017-12-17 LAB — POCT PREGNANCY, URINE: Preg Test, Ur: NEGATIVE

## 2017-12-17 LAB — ETHANOL: Alcohol, Ethyl (B): <10 mg/dL (ref <10)

## 2017-12-17 MED ORDER — KETOROLAC TROMETHAMINE 60 MG/2ML IM SOLN
60.0000 mg | Freq: Once | INTRAMUSCULAR | Status: AC
  Administered 2017-12-17: 60 mg via INTRAMUSCULAR
  Filled 2017-12-17: qty 2

## 2017-12-17 MED ORDER — QUETIAPINE FUMARATE 50 MG PO TABS: 50.0000 mg | ORAL_TABLET | Freq: Every day | ORAL | 0 refills | Status: AC

## 2017-12-17 MED ORDER — CLINDAMYCIN HCL 150 MG PO CAPS
300.0000 mg | ORAL_CAPSULE | Freq: Once | ORAL | Status: AC
  Administered 2017-12-17: 300 mg via ORAL
  Filled 2017-12-17: qty 2

## 2017-12-17 MED ORDER — CLINDAMYCIN HCL 300 MG PO CAPS: 300.0000 mg | ORAL_CAPSULE | Freq: Three times a day (TID) | ORAL | 0 refills | Status: AC

## 2017-12-17 NOTE — ED Notes (Signed)
Pt lying in bed. Able to follow commands. Has L ankle stirrup on. Has house arrest bracelet on R ankle, appears to be missing a piece to bracelet. Pt remains in own clothes at this time. Awaiting telepsych consult. Given a warm blanket and ginger ale.

## 2017-12-17 NOTE — ED Notes (Signed)
Called [facility] for consult [phone]

## 2017-12-17 NOTE — ED Notes (Signed)
Pt verbalized discharge instructions and has no questions at this time 

## 2017-12-17 NOTE — ED Notes (Signed)
Dr. Quale at bedside.  

## 2017-12-17 NOTE — BH Assessment (Signed)
Assessment Note  Frances Le is an 32 y.o. female who presents to ED with initial c/o an injured ankle/foot. She reports hurting her ankle while at work last Thursday. She presents with restless mood, loose thought patterns, and circumstantial speech. Urine drug screen tested positive for methamphetamines, cocaine, cannabis, and opiates. When assessed by this Clinical research associate, pt reports she "took a sleeping pill" that she was unaware of. She reports living in [place], [place] with her mother. Pt refused to provide this Clinical research associate with her mother's contact information for collateral. Pt has past inpatient tx - but unable to verbalize reason for treatment. She reports having a past diagnosis of depression. Pt denied SI/HI/AVH/Delusions.  Diagnosis:  Cocaine Use Disorder, Unspecified Amphetamine Use Disorder, Unspecified Cannabis Use Disorder, Unspecified Opioid Use Disorder, Unspecified Unspecified Depressive Disorder, by history  Past Medical History:  Past Medical History:  Diagnosis Date  . Anxiety   . Depression   . Drug abuse (HCC)   . Pregnant     History reviewed. No pertinent surgical history.  Family History: No family history on file.  Social History:  reports that she has been smoking cigarettes. She has been smoking about 1.00 pack per day. She has never used smokeless tobacco. She reports that she has current or past drug history. Drugs: IV and Methamphetamines. She reports that she does not drink alcohol.  Additional Social History:  Alcohol / Drug Use Pain Medications: See MAR Prescriptions: See MAR Over the Counter: See MAR History of alcohol / drug use?: Yes Longest period of sobriety (when/how long): UKN  CIWA: CIWA-Ar BP: 121/70 Pulse Rate: (!) 110 COWS:    Allergies: No Known Allergies  Home Medications:  (Not in a hospital admission)  OB/GYN Status:  Patient's last menstrual period was 11/21/2017.  General Assessment Data Location of Assessment: [facility] ED TTS  Assessment: In system Is this a Tele or Face-to-Face Assessment?: Face-to-Face Is this an Initial Assessment or a Re-assessment for this encounter?: Initial Assessment Patient Accompanied by:: N/A Language Other than English: No Living Arrangements: (Living with mother) What gender do you identify as?: Female Marital status: Single Maiden name: n/a Pregnancy Status: No Living Arrangements: Parent Can pt return to current living arrangement?: Yes Admission Status: Voluntary Is patient capable of signing voluntary admission?: Yes Referral Source: Self/Family/Friend Insurance type: Medicaid Bond  Medical Screening Exam [facility] Walk-in ONLY) Medical Exam completed: Yes  Crisis Care Plan Living Arrangements: Parent Legal Guardian: Other:(Self) Name of Psychiatrist: None Name of Therapist: None  Education Status Is patient currently in school?: No Is the patient employed, unemployed or receiving disability?: Employed  Risk to self with the past 6 months Suicidal Ideation: No Has patient been a risk to self within the past 6 months prior to admission? : No Suicidal Intent: No Has patient had any suicidal intent within the past 6 months prior to admission? : No Is patient at risk for suicide?: No Suicidal Plan?: No Has patient had any suicidal plan within the past 6 months prior to admission? : No Access to Means: No What has been your use of drugs/alcohol within the last 12 months?: Cocaine Previous Attempts/Gestures: No How many times?: 0 Other Self Harm Risks: None Triggers for Past Attempts: None known Intentional Self Injurious Behavior: None Family Suicide History: Unknown Recent stressful life event(s): Financial Problems(Substance use) Persecutory voices/beliefs?: No Depression: Yes Depression Symptoms: Guilt, Feeling worthless/self pity Substance abuse history and/or treatment for substance abuse?: Yes Suicide prevention information given to non-admitted patients: Not  applicable  Risk to Others within the past 6 months Homicidal Ideation: No Does patient have any lifetime risk of violence toward others beyond the six months prior to admission? : No Thoughts of Harm to Others: No Current Homicidal Intent: No Current Homicidal Plan: No Access to Homicidal Means: No Identified Victim: N/A History of harm to others?: No Assessment of Violence: None Noted Does patient have access to weapons?: No Criminal Charges Pending?: No Does patient have a court date: No Is patient on probation?: No  Psychosis Hallucinations: None noted Delusions: None noted  Mental Status Report Appearance/Hygiene: Disheveled, In scrubs Eye Contact: Fair Motor Activity: Restlessness, Rigidity Speech: Incoherent Level of Consciousness: Restless Mood: Ambivalent, Depressed Affect: Depressed Anxiety Level: Severe Thought Processes: Circumstantial Judgement: Impaired Orientation: Person, Place, Time, Situation Obsessive Compulsive Thoughts/Behaviors: None  Cognitive Functioning Concentration: Decreased Memory: Recent Intact, Remote Intact Is patient IDD: No Insight: Poor Impulse Control: Poor Appetite: Fair Have you had any weight changes? : Loss Amount of the weight change? (lbs): (UKN) Sleep: Decreased Total Hours of Sleep: 3 Vegetative Symptoms: None  ADLScreening [facility] Assessment Services) Patient's cognitive ability adequate to safely complete daily activities?: Yes Patient able to express need for assistance with ADLs?: Yes Independently performs ADLs?: Yes (appropriate for developmental age)  Prior Inpatient Therapy Prior Inpatient Therapy: Yes Prior Therapy Dates: [facility]N Prior Therapy Facilty/Provider(s): [facility] Reason for Treatment: Depression/Substance Use  Prior Outpatient Therapy Prior Outpatient Therapy: ([facility])  ADL Screening (condition at time of admission) Patient's cognitive ability adequate to safely complete daily activities?: Yes Patient  able to express need for assistance with ADLs?: Yes Independently performs ADLs?: Yes (appropriate for developmental age)       Abuse/Neglect Assessment (Assessment to be complete while patient is alone) Abuse/Neglect Assessment Can Be Completed: Unable to assess, patient is non-responsive or altered mental status     Advance Directives (For Healthcare) Does Patient Have a Medical Advance Directive?: No Would patient like information on creating a medical advance directive?: No - Patient declined       Child/Adolescent Assessment Running Away Risk: (Patient is an adult)  Disposition:  Disposition Initial Assessment Completed for this Encounter: Yes Disposition of Patient: (Pending SOC) Patient refused recommended treatment: No Mode of transportation if patient is discharged?: N/A  On Site Evaluation by:   Reviewed with Physician:    Wilmon Arms 12/17/2017 12:49 PM

## 2017-12-17 NOTE — ED Notes (Signed)
Pt given a sprite, graham crackers and peanut butter.

## 2017-12-17 NOTE — ED Provider Notes (Signed)
[facility] Emergency Department Provider Note   ____________________________________________   First MD Initiated Contact with Patient 12/17/17 0501     (approximate)  I have reviewed the triage vital signs and the nursing notes.   HISTORY  Chief Complaint Ankle Injury and Depression    HPI Frances Le is a 32 y.o. female who comes into the hospital today with some left foot ankle and knee pain.  She reports that she hurt it at work.  The patient states that she slipped and fell on a piece of fruit.  She is unsure if she hit her head when she fell.  This occurred sometime at night but the patient is unsure what time.  She works second shift.  She rates her pain a 10 out of 10 in intensity.  She has swelling to her left ankle but she did not ice it or take any medication.  The patient has a problem with drugs and was using some substances.  The patient also thinks she is bipolar.  She is here today for evaluation.  The patient also has some redness and pain to her left axilla.   Past Medical History:  Diagnosis Date  . Anxiety   . Depression   . Pregnant     There are no active problems to display for this patient.   History reviewed. No pertinent surgical history.  Prior to Admission medications   Medication Sig Start Date End Date Taking? Authorizing Provider  clindamycin (CLEOCIN) 300 MG capsule Take 1 capsule (300 mg total) by mouth 3 (three) times daily for 10 days. 12/17/17 12/27/17  Rebecka Apley, MD  ondansetron (ZOFRAN ODT) 4 MG disintegrating tablet Take 1 tablet (4 mg total) by mouth every 8 (eight) hours as needed for nausea or vomiting. 11/26/17   Nita Sickle, MD    Allergies Patient has no known allergies.  No family history on file.  Social History Social History   Tobacco Use  . Smoking status: Current Every Day Smoker    Packs/day: 1.00    Types: Cigarettes  . Smokeless tobacco: Never Used  Substance Use  Topics  . Alcohol use: No    Comment: history  . 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 ENT: No sore throat. Cardiovascular: Denies chest pain. Respiratory: Denies shortness of breath. Gastrointestinal: No abdominal pain.  No nausea, no vomiting.  Genitourinary: Negative for dysuria. Musculoskeletal: Swelling and pain to left ankle Skin: Erythema to left arm and foot with some scabbing ulcers all over her body. Neurological: Negative for headaches,   ____________________________________________   PHYSICAL EXAM:  VITAL SIGNS: ED Triage Vitals  Enc Vitals Group     BP 12/17/17 0431 (!) 121/110     Pulse Rate 12/17/17 0431 (!) 101     Resp 12/17/17 0431 18     Temp 12/17/17 0431 98.2 F (36.8 C)     Temp Source 12/17/17 0431 Oral     SpO2 12/17/17 0431 99 %     Weight 12/17/17 0428 134 lb 15.8 oz (61.2 kg)     Height 12/17/17 0428 5\' 2"  (1.575 m)     Head Circumference --      Peak Flow --      Pain Score 12/17/17 0428 10     Pain Loc --  Pain Edu? --      Excl. in GC? --     Constitutional: Alert and oriented.  Patient in moderate distress. Eyes: Conjunctivae are normal. PERRL. EOMI. Head: Atraumatic. Nose: No congestion/rhinnorhea. Mouth/Throat: Mucous membranes are moist.  Oropharynx non-erythematous. Neck: No cervical spine tenderness to palpation Cardiovascular: Normal rate, regular rhythm. Grossly normal heart sounds.  Good peripheral circulation. Respiratory: Normal respiratory effort.  No retractions. Lungs CTAB. Gastrointestinal: Soft and nontender. No distention.  Positive bowel sounds Musculoskeletal: Mild soft tissue swelling to left ankle with pain with passive range of motion Neurologic:  Normal speech and language.  Skin: Erythema warmth and swelling to left medial antecubital fossa as well as the top of the left  ankle.  Multiple ulcerations to legs arms and face that scabbed over.  Psychiatric: Mood and affect are normal. .  ____________________________________________   LABS (all labs ordered are listed, but only abnormal results are displayed)  Labs Reviewed  COMPREHENSIVE METABOLIC PANEL - Abnormal; Notable for the following components:      Result Value   Glucose, Bld 110 (*)    Calcium 8.4 (*)    Albumin 3.4 (*)    AST 66 (*)    ALT 47 (*)    All other components within normal limits  CBC - Abnormal; Notable for the following components:   WBC 11.1 (*)    HCT 34.5 (*)    All other components within normal limits  ETHANOL  URINE DRUG SCREEN, QUALITATIVE ([facility] ONLY)  POC URINE PREG, ED   ____________________________________________  EKG  none ____________________________________________  RADIOLOGY  ED MD interpretation: DG left knee: No acute fracture dislocation, small suprapatellar effusion  Left foot x-ray: Negative  Official radiology report(s): Dg Knee Complete 4 Views Left  Result Date: 12/17/2017 CLINICAL DATA:  32 year old female with fall and left knee pain. EXAM: LEFT KNEE - COMPLETE 4+ VIEW COMPARISON:  Left knee radiograph dated 12/11/2017 FINDINGS: There is no acute fracture or dislocation. The bones are well mineralized. No arthritic changes. Small suprapatellar effusion. The soft tissues appear unremarkable. IMPRESSION: 1. No acute fracture or dislocation. 2. Small suprapatellar effusion. Electronically Signed   By: Elgie Collard M.D.   On: 12/17/2017 05:43   Dg Foot Complete Left  Result Date: 12/17/2017 CLINICAL DATA:  32 year old female with fall and left foot pain. EXAM: LEFT FOOT - COMPLETE 3+ VIEW COMPARISON:  Left foot radiograph dated 05/05/2014 FINDINGS: There is no acute fracture or dislocation of the left fifth. Old healed metatarsal fractures noted. The bones are well mineralized. No arthritic changes. The soft tissues are unremarkable. IMPRESSION:  Negative. Electronically Signed   By: Elgie Collard M.D.   On: 12/17/2017 05:42    ____________________________________________   PROCEDURES  Procedure(s) performed: None  Procedures  Critical Care performed: No  ____________________________________________   INITIAL IMPRESSION / ASSESSMENT AND PLAN / ED COURSE  As part of my medical decision making, I reviewed the following data within the electronic MEDICAL RECORD NUMBER Notes from prior ED visits and [place] Controlled Substance Database   This is a 32 year old female who comes into the hospital today with some ankle pain after a fall.  The patient also has some erythema to her legs and arm.  We did x-ray the patient's ankle and knee and it does not show any bony abnormality.  I will place the patient in an ankle stirrup.  Also I am concerned about an area of cellulitis to her arm and foot given the erythema.  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  I will give the patient a dose of clindamycin.  We did check some blood work and the patient has a white blood cell count of 11.1.  She also states that she is depressed so I will have the patient evaluated by social work and specialist on-call for her mental illness.  She will receive a shot of Toradol for pain.      ____________________________________________   FINAL CLINICAL IMPRESSION(S) / ED DIAGNOSES  Final diagnoses:  Sprain of left ankle, unspecified ligament, initial encounter  Cellulitis of left upper extremity     ED Discharge Orders         Ordered    clindamycin (CLEOCIN) 300 MG capsule  3 times daily     12/17/17 1610           Note:  This document was prepared using Dragon voice recognition software and may include unintentional dictation errors.    Rebecka Apley, MD 12/17/17 [phone]

## 2017-12-17 NOTE — ED Notes (Signed)
Pt calling for ride home at this time.

## 2017-12-17 NOTE — ED Notes (Signed)
Pt call light went off. Went in rm to answer light. Pt rocking back and forth in bed c/o "bandage coming apart." Pt was pulling at bandage as this tech walked in the rm.

## 2017-12-17 NOTE — ED Provider Notes (Signed)
Patient seen and cleared for discharge by psychiatry.  She has been calm, resting comfortably and appears to be improved.  Suspect patient having some akathisia secondary to her substance use and sympathomimetic abuse which are now improved.  She is resting comfortably understanding of plan for close outpatient follow-up with substance abuse treatment and psychiatric services.  Return precautions and treatment recommendations and follow-up discussed with the patient who is agreeable with the plan.  Prescription for Seroquel given as directed by tele-psychiatrist whom I discussed the case with prior to discharge, and also for clindamycin.   Sharyn Creamer, MD 12/17/17 [phone]

## 2017-12-17 NOTE — ED Notes (Signed)
Pt sitting up on stretcher eating breakfast. 

## 2017-12-17 NOTE — Discharge Instructions (Addendum)

## 2017-12-17 NOTE — ED Notes (Signed)
Pt given cup of water 

## 2017-12-17 NOTE — BH Assessment (Signed)
Per SOC, pt does not meet inpatient criteria. Recommend discharge with follow-up.

## 2017-12-17 NOTE — ED Notes (Signed)
Frances Le [phone]- have pt call when pt wakes up

## 2018-02-10 ENCOUNTER — Encounter: Payer: Self-pay | Admitting: Emergency Medicine

## 2018-02-10 ENCOUNTER — Emergency Department
Admission: EM | Admit: 2018-02-10 | Discharge: 2018-02-10 | Disposition: A | Discharge status: Home / Self Care | Payer: Medicaid Other | Attending: Emergency Medicine | Admitting: Emergency Medicine

## 2018-02-10 ENCOUNTER — Other Ambulatory Visit: Payer: Self-pay

## 2018-02-10 DIAGNOSIS — Z79899 Other long term (current) drug therapy: Secondary | ICD-10-CM | POA: Insufficient documentation

## 2018-02-10 DIAGNOSIS — N898 Other specified noninflammatory disorders of vagina: Secondary | ICD-10-CM | POA: Insufficient documentation

## 2018-02-10 DIAGNOSIS — Y929 Unspecified place or not applicable: Secondary | ICD-10-CM | POA: Insufficient documentation

## 2018-02-10 DIAGNOSIS — Y999 Unspecified external cause status: Secondary | ICD-10-CM | POA: Insufficient documentation

## 2018-02-10 DIAGNOSIS — F17210 Nicotine dependence, cigarettes, uncomplicated: Secondary | ICD-10-CM | POA: Insufficient documentation

## 2018-02-10 DIAGNOSIS — Y9389 Activity, other specified: Secondary | ICD-10-CM | POA: Insufficient documentation

## 2018-02-10 DIAGNOSIS — Y33XXXA Other specified events, undetermined intent, initial encounter: Secondary | ICD-10-CM | POA: Insufficient documentation

## 2018-02-10 DIAGNOSIS — T192XXA Foreign body in vulva and vagina, initial encounter: Secondary | ICD-10-CM

## 2018-02-10 HISTORY — DX: Bipolar disorder, unspecified: F31.9

## 2018-02-10 MED ORDER — CEFTRIAXONE SODIUM 250 MG IJ SOLR
250.0000 mg | Freq: Once | INTRAMUSCULAR | Status: AC
  Administered 2018-02-10: 250 mg via INTRAMUSCULAR
  Filled 2018-02-10: qty 250

## 2018-02-10 MED ORDER — LIDOCAINE HCL (PF) 1 % IJ SOLN
INTRAMUSCULAR | Status: AC
  Administered 2018-02-10: 5 mL
  Filled 2018-02-10: qty 5

## 2018-02-10 MED ORDER — AZITHROMYCIN 500 MG PO TABS
1000.0000 mg | ORAL_TABLET | Freq: Once | ORAL | Status: AC
  Administered 2018-02-10: 1000 mg via ORAL
  Filled 2018-02-10: qty 2

## 2018-02-10 MED ORDER — METRONIDAZOLE 500 MG PO TABS
2000.0000 mg | ORAL_TABLET | Freq: Once | ORAL | Status: AC
  Administered 2018-02-10: 2000 mg via ORAL
  Filled 2018-02-10: qty 4

## 2018-02-10 NOTE — ED Provider Notes (Signed)
[facility] Emergency Department Provider Note  ____________________________________________   First MD Initiated Contact with Patient 02/10/18 1955     (approximate)  I have reviewed the triage vital signs and the nursing notes.   HISTORY  Chief Complaint Foreign Body    HPI Frances Le is a 32 y.o. female presents emergency department with the [facility] PD officers.  She is in custody.  They were doing a body search and thought they saw something like a foreign body hanging out of the vagina.  They would like for [place] to do a pelvic exam to evaluate whether a foreign body is there.  Patient also states she has some lower abdominal pain.    Past Medical History:  Diagnosis Date  . Anxiety   . Bipolar 1 disorder (HCC)   . Depression   . Drug abuse (HCC)   . Pregnant     There are no active problems to display for this patient.   Past Surgical History:  Procedure Laterality Date  . CESAREAN SECTION      Prior to Admission medications   Medication Sig Start Date End Date Taking? Authorizing Provider  ondansetron (ZOFRAN ODT) 4 MG disintegrating tablet Take 1 tablet (4 mg total) by mouth every 8 (eight) hours as needed for nausea or vomiting. 11/26/17   Don Perking, [place], MD  QUEtiapine (SEROQUEL) 50 MG tablet Take 1 tablet (50 mg total) by mouth at bedtime. 12/17/17   Sharyn Creamer, MD    Allergies Patient has no known allergies.  No family history on file.  Social History Social History   Tobacco Use  . Smoking status: Current Every Day Smoker    Packs/day: 1.00    Types: Cigarettes  . Smokeless tobacco: Never Used  Substance Use Topics  . Alcohol use: Yes  . Drug use: Yes    Types: IV, Methamphetamines    Review of Systems  Constitutional: No fever/chills Eyes: No visual changes. ENT: No sore throat. Respiratory: Denies cough Genitourinary: Negative for dysuria.  Positive for questionable foreign body in the  vagina Musculoskeletal: Negative for back pain. Skin: Negative for rash.    ____________________________________________   PHYSICAL EXAM:  VITAL SIGNS: ED Triage Vitals  Enc Vitals Group     BP 02/10/18 1950 (!) 155/85     Pulse Rate 02/10/18 1950 (!) 108     Resp 02/10/18 1950 18     Temp --      Temp src --      SpO2 02/10/18 1950 100 %     Weight 02/10/18 1949 118 lb (53.5 kg)     Height 02/10/18 1949 5\' 3"  (1.6 m)     Head Circumference --      Peak Flow --      Pain Score 02/10/18 1949 9     Pain Loc --      Pain Edu? --      Excl. in GC? --     Constitutional: Alert and oriented.  No acute distress.  Patient has several marks noted on her which are typical for IV drug and meth users.   Eyes: Conjunctivae are normal.  Head: Atraumatic. Nose: No congestion/rhinnorhea. Mouth/Throat: Mucous membranes are moist.   Neck:  supple no lymphadenopathy noted Cardiovascular: Normal rate, regular rhythm. Heart sounds are normal Respiratory: Normal respiratory effort.  No retractions, lungs c t a  Abd: soft nontender bs normal all 4 quad GU: External vaginal exam does not show any herpetic lesions.  Speculum exam shows whitish discharge and a irritated cervix but no foreign body. Musculoskeletal: FROM all extremities, warm and well perfused Neurologic:  Normal speech and language.  Skin:  Skin is warm, dry and intact. No rash noted. Psychiatric: Mood and affect are normal. Speech and behavior are normal.  ____________________________________________   LABS (all labs ordered are listed, but only abnormal results are displayed)  Labs Reviewed - No data to display ____________________________________________   ____________________________________________  RADIOLOGY    ____________________________________________   PROCEDURES  Procedure(s) performed: No  Procedures    ____________________________________________   INITIAL IMPRESSION / ASSESSMENT AND PLAN  / ED COURSE  Pertinent labs & imaging results that were available during my care of the patient were reviewed by me and considered in my medical decision making (see chart for details).   Patient is 32 year old female presents emergency department with the [facility] police who are questioning whether there is a foreign body in her vagina as they saw something white while doing a cavity search.  On physical exam the patient is very thin.  Vaginal exam was performed.  There is a thick whitish discharge but no foreign body.  The patient is a known prostitute.  She was empirically treated with Rocephin 250 mg IM, Zithromax 1 g p.o., metronidazole 2000 mg p.o.  She was discharged in stable condition in the care of the [facility] PD.     As part of my medical decision making, I reviewed the following data within the electronic MEDICAL RECORD NUMBER Nursing notes reviewed and incorporated, Old chart reviewed, Notes from prior ED visits and [place] Controlled Substance Database  ____________________________________________   FINAL CLINICAL IMPRESSION(S) / ED DIAGNOSES  Final diagnoses:  Foreign body in vagina, initial encounter      NEW MEDICATIONS STARTED DURING THIS VISIT:  New Prescriptions   No medications on file     Note:  This document was prepared using Dragon voice recognition software and may include unintentional dictation errors.    Faythe Ghee, PA-C 02/10/18 2030    Nita Sickle, MD 02/11/18 [phone]

## 2018-02-10 NOTE — ED Notes (Signed)
Pt may have foreign body in vagina. Pt is unsure. Pt has used meth today. Pt has wounds all over her body from tweaking. A possible foreign body noted by female officer. Pt does have a significant amount of white discharge.

## 2018-02-10 NOTE — ED Triage Notes (Signed)
Patient brought in by [employer]. Patient states that she has something in her vaginal but unsure what it is.

## 2018-04-14 ENCOUNTER — Inpatient Hospital Stay (HOSPITAL_COMMUNITY): Payer: Medicaid Other

## 2018-04-14 ENCOUNTER — Inpatient Hospital Stay (HOSPITAL_COMMUNITY)
Admission: EM | Admit: 2018-04-14 | Discharge: 2018-05-19 | DRG: 870 | Disposition: E | Payer: Medicaid Other | Attending: Pulmonary Disease | Admitting: Pulmonary Disease

## 2018-04-14 ENCOUNTER — Encounter (HOSPITAL_COMMUNITY): Payer: Self-pay | Admitting: Emergency Medicine

## 2018-04-14 ENCOUNTER — Emergency Department (HOSPITAL_COMMUNITY): Payer: Medicaid Other

## 2018-04-14 ENCOUNTER — Other Ambulatory Visit: Payer: Self-pay

## 2018-04-14 DIAGNOSIS — I2699 Other pulmonary embolism without acute cor pulmonale: Secondary | ICD-10-CM | POA: Diagnosis present

## 2018-04-14 DIAGNOSIS — R0902 Hypoxemia: Secondary | ICD-10-CM

## 2018-04-14 DIAGNOSIS — F1123 Opioid dependence with withdrawal: Secondary | ICD-10-CM | POA: Diagnosis not present

## 2018-04-14 DIAGNOSIS — E875 Hyperkalemia: Secondary | ICD-10-CM | POA: Diagnosis present

## 2018-04-14 DIAGNOSIS — J80 Acute respiratory distress syndrome: Secondary | ICD-10-CM

## 2018-04-14 DIAGNOSIS — G931 Anoxic brain damage, not elsewhere classified: Secondary | ICD-10-CM | POA: Diagnosis not present

## 2018-04-14 DIAGNOSIS — A4101 Sepsis due to Methicillin susceptible Staphylococcus aureus: Secondary | ICD-10-CM | POA: Diagnosis present

## 2018-04-14 DIAGNOSIS — I76 Septic arterial embolism: Secondary | ICD-10-CM | POA: Diagnosis present

## 2018-04-14 DIAGNOSIS — Z79899 Other long term (current) drug therapy: Secondary | ICD-10-CM

## 2018-04-14 DIAGNOSIS — I608 Other nontraumatic subarachnoid hemorrhage: Secondary | ICD-10-CM | POA: Diagnosis not present

## 2018-04-14 DIAGNOSIS — E872 Acidosis: Secondary | ICD-10-CM | POA: Diagnosis not present

## 2018-04-14 DIAGNOSIS — I609 Nontraumatic subarachnoid hemorrhage, unspecified: Secondary | ICD-10-CM

## 2018-04-14 DIAGNOSIS — J9602 Acute respiratory failure with hypercapnia: Secondary | ICD-10-CM | POA: Diagnosis present

## 2018-04-14 DIAGNOSIS — E8770 Fluid overload, unspecified: Secondary | ICD-10-CM | POA: Diagnosis present

## 2018-04-14 DIAGNOSIS — Z9911 Dependence on respirator [ventilator] status: Secondary | ICD-10-CM

## 2018-04-14 DIAGNOSIS — E43 Unspecified severe protein-calorie malnutrition: Secondary | ICD-10-CM | POA: Diagnosis not present

## 2018-04-14 DIAGNOSIS — B9620 Unspecified Escherichia coli [E. coli] as the cause of diseases classified elsewhere: Secondary | ICD-10-CM | POA: Diagnosis present

## 2018-04-14 DIAGNOSIS — F19939 Other psychoactive substance use, unspecified with withdrawal, unspecified: Secondary | ICD-10-CM

## 2018-04-14 DIAGNOSIS — G9340 Encephalopathy, unspecified: Secondary | ICD-10-CM

## 2018-04-14 DIAGNOSIS — F17210 Nicotine dependence, cigarettes, uncomplicated: Secondary | ICD-10-CM | POA: Diagnosis present

## 2018-04-14 DIAGNOSIS — N390 Urinary tract infection, site not specified: Secondary | ICD-10-CM | POA: Diagnosis present

## 2018-04-14 DIAGNOSIS — G9382 Brain death: Secondary | ICD-10-CM | POA: Diagnosis not present

## 2018-04-14 DIAGNOSIS — D65 Disseminated intravascular coagulation [defibrination syndrome]: Secondary | ICD-10-CM | POA: Diagnosis present

## 2018-04-14 DIAGNOSIS — R6520 Severe sepsis without septic shock: Secondary | ICD-10-CM

## 2018-04-14 DIAGNOSIS — J189 Pneumonia, unspecified organism: Secondary | ICD-10-CM

## 2018-04-14 DIAGNOSIS — J181 Lobar pneumonia, unspecified organism: Secondary | ICD-10-CM | POA: Diagnosis not present

## 2018-04-14 DIAGNOSIS — M311 Thrombotic microangiopathy: Secondary | ICD-10-CM | POA: Diagnosis not present

## 2018-04-14 DIAGNOSIS — I330 Acute and subacute infective endocarditis: Secondary | ICD-10-CM

## 2018-04-14 DIAGNOSIS — N170 Acute kidney failure with tubular necrosis: Secondary | ICD-10-CM | POA: Diagnosis not present

## 2018-04-14 DIAGNOSIS — B1920 Unspecified viral hepatitis C without hepatic coma: Secondary | ICD-10-CM | POA: Diagnosis present

## 2018-04-14 DIAGNOSIS — Z9289 Personal history of other medical treatment: Secondary | ICD-10-CM

## 2018-04-14 DIAGNOSIS — R6521 Severe sepsis with septic shock: Secondary | ICD-10-CM | POA: Diagnosis present

## 2018-04-14 DIAGNOSIS — F319 Bipolar disorder, unspecified: Secondary | ICD-10-CM | POA: Diagnosis present

## 2018-04-14 DIAGNOSIS — D638 Anemia in other chronic diseases classified elsewhere: Secondary | ICD-10-CM | POA: Diagnosis not present

## 2018-04-14 DIAGNOSIS — K7200 Acute and subacute hepatic failure without coma: Secondary | ICD-10-CM | POA: Diagnosis not present

## 2018-04-14 DIAGNOSIS — Z6820 Body mass index (BMI) 20.0-20.9, adult: Secondary | ICD-10-CM | POA: Diagnosis not present

## 2018-04-14 DIAGNOSIS — D696 Thrombocytopenia, unspecified: Secondary | ICD-10-CM

## 2018-04-14 DIAGNOSIS — Z95828 Presence of other vascular implants and grafts: Secondary | ICD-10-CM

## 2018-04-14 DIAGNOSIS — J9601 Acute respiratory failure with hypoxia: Secondary | ICD-10-CM

## 2018-04-14 DIAGNOSIS — F14129 Cocaine abuse with intoxication, unspecified: Secondary | ICD-10-CM | POA: Diagnosis present

## 2018-04-14 DIAGNOSIS — N179 Acute kidney failure, unspecified: Secondary | ICD-10-CM

## 2018-04-14 DIAGNOSIS — R4182 Altered mental status, unspecified: Secondary | ICD-10-CM | POA: Diagnosis present

## 2018-04-14 DIAGNOSIS — I6340 Cerebral infarction due to embolism of unspecified cerebral artery: Secondary | ICD-10-CM

## 2018-04-14 DIAGNOSIS — R233 Spontaneous ecchymoses: Secondary | ICD-10-CM | POA: Diagnosis present

## 2018-04-14 DIAGNOSIS — A419 Sepsis, unspecified organism: Secondary | ICD-10-CM

## 2018-04-14 DIAGNOSIS — Z515 Encounter for palliative care: Secondary | ICD-10-CM | POA: Diagnosis present

## 2018-04-14 DIAGNOSIS — F419 Anxiety disorder, unspecified: Secondary | ICD-10-CM | POA: Diagnosis present

## 2018-04-14 DIAGNOSIS — Z452 Encounter for adjustment and management of vascular access device: Secondary | ICD-10-CM

## 2018-04-14 DIAGNOSIS — M3119 Other thrombotic microangiopathy: Secondary | ICD-10-CM | POA: Diagnosis present

## 2018-04-14 LAB — CBC WITH DIFFERENTIAL/PLATELET
Abs Immature Granulocytes: 1.88 10*3/uL — ABNORMAL HIGH (ref 0.00–0.07)
Basophils Absolute: 0 10*3/uL (ref 0.0–0.1)
Basophils Relative: 0 %
Eosinophils Absolute: 0 10*3/uL (ref 0.0–0.5)
Eosinophils Relative: 0 %
HCT: 39.8 % (ref 36.0–46.0)
HEMOGLOBIN: 13 g/dL (ref 12.0–15.0)
Immature Granulocytes: 5 %
Lymphocytes Relative: 3 %
Lymphs Abs: 1.2 10*3/uL (ref 0.7–4.0)
MCH: 28 pg (ref 26.0–34.0)
MCHC: 32.7 g/dL (ref 30.0–36.0)
MCV: 85.8 fL (ref 80.0–100.0)
Monocytes Absolute: 1 10*3/uL (ref 0.1–1.0)
Monocytes Relative: 3 %
Neutro Abs: 32.3 10*3/uL — ABNORMAL HIGH (ref 1.7–7.7)
Neutrophils Relative %: 89 %
Platelets: 8 10*3/uL — CL (ref 150–400)
RBC: 4.64 MIL/uL (ref 3.87–5.11)
RDW: 14.6 % (ref 11.5–15.5)
WBC: 36.4 10*3/uL — ABNORMAL HIGH (ref 4.0–10.5)
nRBC: 0 % (ref 0.0–0.2)

## 2018-04-14 LAB — URINALYSIS, ROUTINE W REFLEX MICROSCOPIC
BILIRUBIN URINE: NEGATIVE
Glucose, UA: NEGATIVE mg/dL
Ketones, ur: NEGATIVE mg/dL
NITRITE: POSITIVE — AB
Protein, ur: NEGATIVE mg/dL
Specific Gravity, Urine: 1.014 (ref 1.005–1.030)
pH: 5 (ref 5.0–8.0)

## 2018-04-14 LAB — COMPREHENSIVE METABOLIC PANEL
ALT: 35 U/L (ref 0–44)
AST: 53 U/L — ABNORMAL HIGH (ref 15–41)
Albumin: 2.1 g/dL — ABNORMAL LOW (ref 3.5–5.0)
Alkaline Phosphatase: 139 U/L — ABNORMAL HIGH (ref 38–126)
Anion gap: 16 — ABNORMAL HIGH (ref 5–15)
BILIRUBIN TOTAL: 6.8 mg/dL — AB (ref 0.3–1.2)
BUN: 116 mg/dL — ABNORMAL HIGH (ref 6–20)
CO2: 19 mmol/L — ABNORMAL LOW (ref 22–32)
Calcium: 7.7 mg/dL — ABNORMAL LOW (ref 8.9–10.3)
Chloride: 98 mmol/L (ref 98–111)
Creatinine, Ser: 2.42 mg/dL — ABNORMAL HIGH (ref 0.44–1.00)
GFR calc Af Amer: 30 mL/min — ABNORMAL LOW (ref >60)
GFR calc non Af Amer: 26 mL/min — ABNORMAL LOW (ref >60)
Glucose, Bld: 126 mg/dL — ABNORMAL HIGH (ref 70–99)
Potassium: 3.2 mmol/L — ABNORMAL LOW (ref 3.5–5.1)
Sodium: 133 mmol/L — ABNORMAL LOW (ref 135–145)
TOTAL PROTEIN: 5.8 g/dL — AB (ref 6.5–8.1)

## 2018-04-14 LAB — PROCALCITONIN: Procalcitonin: 16.86 ng/mL

## 2018-04-14 LAB — POCT I-STAT 3, ART BLOOD GAS (G3+)
Acid-base deficit: 7 mmol/L — ABNORMAL HIGH (ref 0.0–2.0)
BICARBONATE: 15.9 mmol/L — AB (ref 20.0–28.0)
O2 Saturation: 97 %
Patient temperature: 38.1
TCO2: 17 mmol/L — AB (ref 22–32)
pCO2 arterial: 25.2 mmHg — ABNORMAL LOW (ref 32.0–48.0)
pH, Arterial: 7.413 (ref 7.350–7.450)
pO2, Arterial: 94 mmHg (ref 83.0–108.0)

## 2018-04-14 LAB — PREGNANCY, URINE: Preg Test, Ur: NEGATIVE

## 2018-04-14 LAB — I-STAT BETA HCG BLOOD, ED (MC, WL, AP ONLY): I-stat hCG, quantitative: <5 m[IU]/mL (ref <5)

## 2018-04-14 LAB — I-STAT CG4 LACTIC ACID, ED
Lactic Acid, Venous: 5.12 mmol/L (ref 0.5–1.9)
Lactic Acid, Venous: 4.1 mmol/L (ref 0.5–1.9)

## 2018-04-14 LAB — PROTIME-INR
INR: 1.3
PROTHROMBIN TIME: 16.1 s — AB (ref 11.4–15.2)

## 2018-04-14 LAB — GLUCOSE, CAPILLARY: Glucose-Capillary: 103 mg/dL — ABNORMAL HIGH (ref 70–99)

## 2018-04-14 LAB — MRSA PCR SCREENING: MRSA by PCR: NEGATIVE

## 2018-04-14 LAB — STREP PNEUMONIAE URINARY ANTIGEN: Strep Pneumo Urinary Antigen: NEGATIVE

## 2018-04-14 LAB — AMMONIA: Ammonia: 34 umol/L (ref 9–35)

## 2018-04-14 MED ORDER — SODIUM CHLORIDE 0.9% IV SOLUTION
Freq: Once | INTRAVENOUS | Status: AC
  Administered 2018-04-15: 03:00:00 via INTRAVENOUS

## 2018-04-14 MED ORDER — SODIUM CHLORIDE 0.9 % IV BOLUS
1000.0000 mL | Freq: Once | INTRAVENOUS | Status: AC
  Administered 2018-04-14: 1000 mL via INTRAVENOUS

## 2018-04-14 MED ORDER — LORAZEPAM 2 MG/ML IJ SOLN
1.0000 mg | Freq: Once | INTRAMUSCULAR | Status: AC
  Administered 2018-04-14: 1 mg via INTRAVENOUS
  Filled 2018-04-14: qty 1

## 2018-04-14 MED ORDER — SODIUM CHLORIDE 0.9 % IV SOLN
500.0000 mg | Freq: Once | INTRAVENOUS | Status: AC
  Administered 2018-04-14: 500 mg via INTRAVENOUS
  Filled 2018-04-14: qty 500

## 2018-04-14 MED ORDER — VANCOMYCIN HCL IN DEXTROSE 1-5 GM/200ML-% IV SOLN
1000.0000 mg | Freq: Once | INTRAVENOUS | Status: AC
  Administered 2018-04-14: 1000 mg via INTRAVENOUS
  Filled 2018-04-14: qty 200

## 2018-04-14 MED ORDER — SODIUM CHLORIDE 0.9 % IV SOLN: 500.0000 mg | INTRAVENOUS | Status: DC

## 2018-04-14 MED ORDER — SODIUM CHLORIDE 0.9 % IV SOLN
1.0000 g | INTRAVENOUS | Status: DC
  Filled 2018-04-14: qty 10

## 2018-04-14 MED ORDER — SODIUM CHLORIDE 0.9 % IV BOLUS
2000.0000 mL | Freq: Once | INTRAVENOUS | Status: AC
  Administered 2018-04-14: 2000 mL via INTRAVENOUS

## 2018-04-14 MED ORDER — THIAMINE HCL 100 MG/ML IJ SOLN
100.0000 mg | Freq: Once | INTRAMUSCULAR | Status: AC
  Administered 2018-04-14: 100 mg via INTRAVENOUS
  Filled 2018-04-14: qty 2

## 2018-04-14 MED ORDER — SODIUM CHLORIDE 0.9 % IV SOLN
1.0000 g | Freq: Once | INTRAVENOUS | Status: AC
  Administered 2018-04-14: 1 g via INTRAVENOUS
  Filled 2018-04-14: qty 10

## 2018-04-14 MED ORDER — VANCOMYCIN HCL IN DEXTROSE 1-5 GM/200ML-% IV SOLN: 1000.0000 mg | INTRAVENOUS | Status: DC

## 2018-04-14 MED ORDER — SODIUM CHLORIDE 0.9 % IV SOLN
INTRAVENOUS | Status: AC
  Administered 2018-04-14: 20:00:00 via INTRAVENOUS

## 2018-04-14 MED ORDER — HALOPERIDOL LACTATE 5 MG/ML IJ SOLN
5.0000 mg | Freq: Once | INTRAMUSCULAR | Status: AC
  Administered 2018-04-14: 5 mg via INTRAVENOUS
  Filled 2018-04-14: qty 1

## 2018-04-14 MED ORDER — ACETAMINOPHEN 325 MG PO TABS
650.0000 mg | ORAL_TABLET | ORAL | Status: DC | PRN
  Administered 2018-04-15: 650 mg via ORAL
  Filled 2018-04-14: qty 2

## 2018-04-14 MED ORDER — SODIUM CHLORIDE 0.9% IV SOLUTION: Freq: Once | INTRAVENOUS | Status: DC

## 2018-04-14 MED ORDER — PANTOPRAZOLE SODIUM 40 MG IV SOLR
40.0000 mg | Freq: Every day | INTRAVENOUS | Status: DC
  Administered 2018-04-14 – 2018-04-15 (×2): 40 mg via INTRAVENOUS
  Filled 2018-04-14 (×2): qty 40

## 2018-04-14 NOTE — ED Notes (Signed)
Date and time results received: 04/15/2018 3:27 PM   Test: platlet Critical Value: 8  Name of Provider Notified: Effie ShyWentz  Orders Received? Or Actions Taken?:

## 2018-04-14 NOTE — ED Provider Notes (Signed)
[facility] EMERGENCY DEPARTMENT Provider Note   CSN: [id number] Arrival date & time: 04/13/2018  1313     History   Chief Complaint Chief Complaint  Patient presents with  . Altered Mental Status    HPI Frances Le is a 32 y.o. female.  HPI   Patient brought from jail, where she has been incarcerated for 8 days.  She is reportedly having decreased responsiveness and was thought to have a possible toxic state from heroin withdrawal.  No other history is available.  Level 5 caveat-altered mental status  Past Medical History:  Diagnosis Date  . Anxiety   . Bipolar 1 disorder ([place])   . Depression   . Drug abuse ([place])   . Pregnant     Patient Active Problem List   Diagnosis Date Noted  . 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 CESAREAN SECTION       OB History    Gravida  1   Para      Term      Preterm      AB      Living        SAB      TAB      Ectopic      Multiple      Live Births               Home Medications    Prior to Admission medications   Medication Sig Start Date End Date Taking? Authorizing Provider  ondansetron (ZOFRAN ODT) 4 MG disintegrating tablet Take 1 tablet (4 mg total) by mouth every 8 (eight) hours as needed for nausea or vomiting. 11/26/17   Alfred Levins, [place], MD  QUEtiapine (SEROQUEL) 50 MG tablet Take 1 tablet (50 mg total) by mouth at bedtime. 12/17/17   Delman Kitten, MD    Family History No family history on file.  Social History Social History   Tobacco Use  . Smoking status: Current Every Day Smoker    Packs/day: 1.00    Types: Cigarettes  . Smokeless tobacco: Never Used  Substance Use Topics  . Alcohol use: Yes  . Drug use: Yes    Types: IV, Methamphetamines     Allergies   Patient has no known allergies.   Review of Systems Review of Systems  Unable to perform ROS: Mental status change     Physical Exam Updated Vital Signs BP  (!) 89/58 (BP Location: Left Arm)   Pulse (!) 128   Temp 97.8 F (36.6 C) (Rectal)   Resp (!) 39   Ht '5\' 2"'  (1.575 m)   Wt 49.9 kg   SpO2 98%   BMI 20.12 kg/m   Physical Exam Vitals signs and nursing note reviewed.  Constitutional:      General: She is in acute distress.     Appearance: She is well-developed. She is ill-appearing and toxic-appearing. She is not diaphoretic.     Comments: Cries out constantly and moaning/squealing sound.  HENT:     Head: Normocephalic and atraumatic.     Right Ear: Ear canal normal.     Left Ear: Ear canal normal.     Nose: Nose normal.     Mouth/Throat:     Comments: Dry oral mucous membranes with cracked lips.  Mild bleeding from lips. Eyes:     Conjunctiva/sclera: Conjunctivae normal.     Pupils: Pupils are equal, round, and reactive to light.  Neck:  Musculoskeletal: Normal range of motion and neck supple.     Trachea: Phonation normal.  Cardiovascular:     Rate and Rhythm: Normal rate and regular rhythm.  Pulmonary:     Effort: Pulmonary effort is normal.     Breath sounds: Normal breath sounds.  Chest:     Chest wall: No tenderness.  Abdominal:     General: There is no distension.     Palpations: Abdomen is soft.     Tenderness: There is no abdominal tenderness. There is no guarding.  Musculoskeletal: Normal range of motion.        General: No swelling or tenderness.  Skin:    General: Skin is warm and dry.     Comments: Numerous excoriations, arms and legs, anterior surfaces, nonspecific appearance, possibly related to scratching, versus skin popping.  Neurological:     Mental Status: She is lethargic and disoriented.     GCS: GCS eye subscore is 4. GCS verbal subscore is 4. GCS motor subscore is 5.     Motor: No abnormal muscle tone.     Comments: Moves all extremities equally, requires holding to place IV access.  Psychiatric:     Comments: Agitated, does not follow commands.      ED Treatments / Results   Labs (all labs ordered are listed, but only abnormal results are displayed) Labs Reviewed  COMPREHENSIVE METABOLIC PANEL - Abnormal; Notable for the following components:      Result Value   Sodium 133 (*)    Potassium 3.2 (*)    CO2 19 (*)    Glucose, Bld 126 (*)    BUN 116 (*)    Creatinine, Ser 2.42 (*)    Calcium 7.7 (*)    Total Protein 5.8 (*)    Albumin 2.1 (*)    AST 53 (*)    Alkaline Phosphatase 139 (*)    Total Bilirubin 6.8 (*)    GFR calc non Af Amer 26 (*)    GFR calc Af Amer 30 (*)    Anion gap 16 (*)    All other components within normal limits  CBC WITH DIFFERENTIAL/PLATELET - Abnormal; Notable for the following components:   WBC 36.4 (*)    Platelets 8 (*)    Neutro Abs 32.3 (*)    Abs Immature Granulocytes 1.88 (*)    All other components within normal limits  PROTIME-INR - Abnormal; Notable for the following components:   Prothrombin Time 16.1 (*)    All other components within normal limits  I-STAT CG4 LACTIC ACID, ED - Abnormal; Notable for the following components:   Lactic Acid, Venous 5.12 (*)    All other components within normal limits  I-STAT CG4 LACTIC ACID, ED - Abnormal; Notable for the following components:   Lactic Acid, Venous 4.10 (*)    All other components within normal limits  CULTURE, BLOOD (ROUTINE X 2)  CULTURE, BLOOD (ROUTINE X 2)  RESPIRATORY PANEL BY PCR  AMMONIA  URINALYSIS, ROUTINE W REFLEX MICROSCOPIC  DIC (DISSEMINATED INTRAVASCULAR COAGULATION) PANEL  HIV ANTIBODY (ROUTINE TESTING W REFLEX)  PROCALCITONIN  LEGIONELLA PNEUMOPHILA SEROGP 1 UR AG  STREP PNEUMONIAE URINARY ANTIGEN  PATHOLOGIST SMEAR REVIEW  I-STAT BETA HCG BLOOD, ED ([facility], [facility], [facility] ONLY)  PREPARE PLATELET PHERESIS    EKG EKG Interpretation  Date/Time:  Saturday April 14 2018 13:27:50 EST Ventricular Rate:  115 PR Interval:    QRS Duration: 90 QT Interval:  358 QTC Calculation: 496 R Axis:  84 Text Interpretation:  Sinus tachycardia Minimal ST  depression, inferior leads Prolonged QT interval Since last tracing QT has shortened Confirmed by Daleen Bo [id number]) on 03/19/2018 1:34:37 PM   Radiology Dg Chest 1 View  Result Date: 03/26/2018 CLINICAL DATA:  Sepsis. Heroin withdrawal. EXAM: CHEST  1 VIEW COMPARISON:  11/26/2017 FINDINGS: Normal heart size and mediastinal contours. Confluent opacities are noted in both upper lobes and to a greater extent in the right lower lobe without cavitation. Multilobar pneumonia might account for this or potentially a septic emboli without cavitation. No effusion or edema. No bone destruction or fracture. IMPRESSION: Confluent opacities in both upper lobes and to a greater extent in the right lower lobe without cavitation. Multilobar pneumonia might account for this or potentially non cavitary septic emboli given patient history. Electronically Signed   By: Ashley Royalty M.D.   On: 03/30/2018 15:30   Ct Head Wo Contrast  Result Date: 04/17/2018 CLINICAL DATA:  Patient detoxing from heroin. Altered mental status EXAM: CT HEAD WITHOUT CONTRAST TECHNIQUE: Contiguous axial images were obtained from the base of the skull through the vertex without intravenous contrast. COMPARISON:  None. FINDINGS: Brain: Ventricles and sulci are appropriate for patient's age. No evidence for acute cortically based infarct, intracranial hemorrhage, mass lesion or mass-effect. Vascular: Unremarkable Skull: Intact Sinuses/Orbits: Paranasal sinuses are well aerated. Mastoid air cells unremarkable. Orbits are unremarkable. Other: None. IMPRESSION: No acute intracranial process. Electronically Signed   By: Lovey Newcomer M.D.   On: 03/25/2018 15:21    Procedures .Critical Care Performed by: Daleen Bo, MD Authorized by: Daleen Bo, MD   Critical care provider statement:    Critical care time (minutes):  95   Critical care start time:  03/29/2018 1:30 PM   Critical care end time:  04/13/2018 6:09 PM   Critical care time  was exclusive of:  Separately billable procedures and treating other patients   Critical care was necessary to treat or prevent imminent or life-threatening deterioration of the following conditions:  Sepsis   Critical care was time spent personally by me on the following activities:  Blood draw for specimens, development of treatment plan with patient or surrogate, discussions with consultants, evaluation of patient's response to treatment, examination of patient, obtaining history from patient or surrogate, ordering and performing treatments and interventions, ordering and review of laboratory studies, pulse oximetry, re-evaluation of patient's condition, review of old charts and ordering and review of radiographic studies   (including critical care time)  Medications Ordered in ED Medications  0.9 %  sodium chloride infusion (Manually program via Guardrails IV Fluids) (has no administration in time range)  0.9 %  sodium chloride infusion (has no administration in time range)  sodium chloride 0.9 % bolus 1,000 mL (0 mLs Intravenous Stopped 03/20/2018 1620)  haloperidol lactate (HALDOL) injection 5 mg (5 mg Intravenous Given 04/08/2018 1439)  LORazepam (ATIVAN) injection 1 mg (1 mg Intravenous Given 03/25/2018 1439)  thiamine (B-1) injection 100 mg (100 mg Intravenous Given 04/07/2018 1439)  sodium chloride 0.9 % bolus 2,000 mL (0 mLs Intravenous Stopped 04/12/2018 1905)  cefTRIAXone (ROCEPHIN) 1 g in sodium chloride 0.9 % 100 mL IVPB (0 g Intravenous Stopped 03/23/2018 1802)  azithromycin (ZITHROMAX) 500 mg in sodium chloride 0.9 % 250 mL IVPB (0 mg Intravenous Stopped 03/25/2018 1905)  vancomycin (VANCOCIN) IVPB 1000 mg/200 mL premix (0 mg Intravenous Stopped 04/12/2018 1927)  LORazepam (ATIVAN) injection 1 mg (1 mg Intravenous Given 03/18/2018 1903)     Initial Impression /  Assessment and Plan / ED Course  I have reviewed the triage vital signs and the nursing notes.  Pertinent labs & imaging results that  were available during my care of the patient were reviewed by me and considered in my medical decision making (see chart for details).  Clinical Course as of Apr 15 1931  Sat Apr 14, 2018  1645 Increased, but improving lactate  I-Stat CG4 Lactic Acid, ED(!!) [EW]  1645 Normal except elevated white count and low platelets  CBC with Differential(!!) [EW]  1645 Mild elevation  Protime-INR(!) [EW]  1646 Normal  I-Stat beta hCG blood, ED [EW]  1646 Normal except sodium low, potassium low, CO2 low, glucose high, BUN high, creatinine high, calcium low, total protein low, albumin low, AST high, alk phos stays high, total bilirubin high, GFR low  Comprehensive metabolic panel(!) [EW]  6384 Consistent with bilateral upper lobe opacities, nonspecific, images reviewed by me  DG Chest 1 View [EW]  1648 No acute intracranial lesions, images reviewed by me  CT Head Wo Contrast [EW]  5364 At this time consideration for sepsis is significant therefore will treat with a second set of blood cultures, and initiate treatment for lung infection, possibly community-acquired pneumonia.  Lactate improving after single bolus of saline.  We will give additional high-volume saline bolus.   [EW]  1807 Requested callback from intensivist at [facility].   [EW]  1831 Case discussed with intensivist, he accepts patient to the ICU for further care and treatment.  He requested a number of additional tests, including platelet transfusion after blood smear obtained.   [EW]  1930 Platelet transfusion not initiated, at this facility, because the patient is going to be imminently transferred, to [facility].  This facility where she is currently, does not have platelets on hand.  They would have to be requested and sent by courier here, and will not be available prior to her departure from this emergency department to [facility].  Therefore, she will need a blood type and platelet transfusion after arrival at [facility].    [EW]    Clinical Course User Index [EW] Daleen Bo, MD     Patient Vitals for the past 24 hrs:  BP Temp Temp src Pulse Resp SpO2 Height Weight  03/26/2018 1901 (!) 89/58 - - (!) 128 (!) 39 98 % - -  03/18/2018 1830 112/60 - - (!) 124 (!) 34 98 % - -  03/30/2018 1805 - 97.8 F (36.6 C) Rectal - - - - -  03/22/2018 1800 103/75 - - (!) 127 (!) 34 94 % - -  04/01/2018 1730 (!) 117/54 - - (!) 129 (!) 32 97 % - -  03/30/2018 1700 (!) 105/41 - - (!) 125 (!) 32 97 % - -  04/16/2018 1630 (!) 94/46 - - (!) 122 (!) 29 96 % - -  04/13/2018 1600 (!) 98/52 - - (!) 122 (!) 25 97 % - -  04/04/2018 1538 (!) 105/51 - - (!) 124 12 97 % - -  04/11/2018 1500 107/62 - - (!) 117 (!) 21 97 % - -  04/13/2018 1446 - 98 F (36.7 C) Rectal - - - - -  04/10/2018 1430 (!) 95/48 - - (!) 115 (!) 27 99 % - -  04/12/2018 1400 (!) 98/49 - - (!) 116 (!) 22 97 % - -  04/12/2018 1328 - - - - - - '5\' 2"'  (1.575 m) 49.9 kg  03/18/2018 1327 (!) 96/57 97.8  F (36.6 C) Axillary (!) 114 18 98 % - -    7:32 PM Reevaluation with update and discussion. After initial assessment and treatment, an updated evaluation reveals recent agitation, required additional treatment with Ativan.  Patient calmer now.  Law enforcement with patient and will travel with her to [place]: Patient presenting with decreased responsiveness, agitated and uncooperative.  Nonspecific encephalopathy, with borderline low temperature.  Blood pressure borderline low, but improving with IV fluids.  Actually elevated white count, with likely pulmonary source.  Concern for endocarditis, with septic emboli.  Acute renal failure nonspecific but potentially related to emboli as well.  Patient does appear clinically dehydrated/volume depleted.  Patient is critically ill and will require management in intensive care unit by multispecialty provider care team.  CRITICAL CARE-yes Performed by: Daleen Bo  Nursing Notes Reviewed/ Care  Coordinated Applicable Imaging Reviewed Interpretation of Laboratory Data incorporated into ED treatment  Plan admit as transfer to [facility], ICU.  Final Clinical Impressions(s) / ED Diagnoses   Final diagnoses:  Community acquired pneumonia, unspecified laterality  AKI (acute kidney injury) ([place])  Encephalopathy  Sepsis with acute renal failure without septic shock, due to unspecified organism, unspecified acute renal failure type ([place])  Thrombocytopenia ([place])  Hyperbilirubinemia    ED Discharge Orders    None       Daleen Bo, MD 04/13/2018 1933

## 2018-04-14 NOTE — ED Notes (Signed)
Pt to CT

## 2018-04-14 NOTE — ED Notes (Signed)
CRITICAL VALUE ALERT  Critical Value:  Lactic 5.12  Date & Time Notied: 2017-05-06 1438  Provider Notified: Dr. Effie ShyWentz   Orders Received/Actions taken: None yet

## 2018-04-14 NOTE — ED Notes (Signed)
Repeat Lactic high, dr aware.

## 2018-04-14 NOTE — ED Triage Notes (Signed)
[place]. Patient inmate x8 days. Patient detoxing from heroin. Per evaluated by medical staff patient has altered mental status and only responding to pain. Patients' right hand swollen with pitting edema. Patient's blood pressure 100/55, hr 118, O2 sat 94% per medical staff at jail. Patient alert at this time but only verbally response to pain. Pupils dilated. Patient avoiding eye contact.

## 2018-04-14 NOTE — ED Notes (Signed)
Attempted to I&O cath patient. No urine return. Pt's mucous membranes are very dry. Have applied a pure wick.

## 2018-04-14 NOTE — Progress Notes (Signed)
Pharmacy Antibiotic Note  Frances GlasgowBertha M XXXSutton is a 32 y.o. female admitted on 04/04/2018 with altered mental status. The patient is a heroin user. In jail x  8 days. Less responsive few days. Pharmacy has been consulted for Vancomycin dosing for pneumonia and pharmacy to adjust antibiotics for renal function.  Vancomycin 1000 mg IV x1 given in ED at 18:16 tonight 03/21/2018.  SCr 2.42  Plan: Vancomycin 1g given x1 in ED  Give Vancomycin 750 mg IV Q 48 hrs. Goal AUC 400-550. Expected AUC: 434 SCr used: 2.42 No adjustment in azithromycin or ceftriaxone necessary for renal function.  Vancomycin   Height: 5\' 2"  (157.5 cm) Weight: 110 lb (49.9 kg) IBW/kg (Calculated) : 50.1  Temp (24hrs), Avg:97.9 F (36.6 C), Min:97.8 F (36.6 C), Max:98 F (36.7 C)  Recent Labs  Lab 04/01/2018 1425 04/02/2018 1430 03/23/2018 1631  WBC 36.4*  --   --   CREATININE 2.42*  --   --   LATICACIDVEN  --  5.12* 4.10*    Estimated Creatinine Clearance: 26.8 mL/min (A) (by C-G formula based on SCr of 2.42 mg/dL (H)).    No Known Allergies  Antimicrobials this admission: Vanc 12/28>> Ceftriaxone 12/28>> Azithromycin 12/28>> Dose adjustments this admission:   Microbiology results: F/u cultures   Thank you for allowing pharmacy to be a part of this patient's care.  Noah Delaineuth Mignon Bechler, RPh Clinical Pharmacist Please check AMION for all [facility] phone numbers After 10:00 PM, call Main Pharmacy [phone] 04/07/2018 9:25 PM

## 2018-04-14 NOTE — ED Notes (Signed)
CRITICAL VALUE ALERT  Critical Value: Lactic 4.1  Date & Time Notied:   04/12/2018 1636   Provider Notified: Dr. Effie ShyWentz   Orders Received/Actions taken: Fluids

## 2018-04-14 NOTE — ED Notes (Signed)
Extremely difficult time drawing blood work. Now have 2 IV's

## 2018-04-14 NOTE — ED Notes (Signed)
2nd Lactic will need to be drawn at 1630

## 2018-04-14 NOTE — H&P (Signed)
NAME:  Frances GlasgowBertha M XXXSutton, MRN:  [id number], DOB:  July 03, 1985, LOS: 0 ADMISSION DATE:  03/24/2018, CONSULTATION DATE:  03/18/2018 [facility]ERRING MD[facility]  [facility], CHIEF COMPLAINT:  Sepsis/thrombcytopenia   Brief History   32 year old female tx from [facility] for sepsis, thrombocytopenia, TTP?  History of present illness   32 year old female history of substance abuse incarcerated for 8 days less responsive over the last few days. Brought to [facility] 04/13/2018.  Found somnolent, hypotensive, lactate 5.  cxr showed PNA.  Cbc showed plt 8.  Concern for TTP, sepsis.  Treated with iv abx vancomycine, ceftriaxone, azithromycin 04/03/2018.  tx to [place] for ICU management.    On presentation, patient is somnolent, but withdraws to pain.  Tachycardic on admission.    Past Medical History  Polysubstance abuse   Significant Hospital Events     Consults:    Procedures:    Significant Diagnostic Tests:  Lactate 5 03/27/2018 cxr right sided pna 04/05/2018  Micro Data:  Blood cx 03/19/2018 pending Urine cx 03/31/2018 pending   Antimicrobials:  vanc 04/15/2018-  Ceftriaxone 04/17/2018-  azithromycine 03/25/2018-    Interim history/subjective:  tx from [place] , tachcardic otherwise hemodynamically stable   Objective   Blood pressure (!) 112/57, pulse (!) 132, temperature 97.8 F (36.6 C), temperature source Rectal, resp. rate (!) 39, height 5\' 2"  (1.575 m), weight 49.9 kg, SpO2 98 %, unknown if currently breastfeeding.        Intake/Output Summary (Last 24 hours) at 03/27/2018 2129 Last data filed at 03/20/2018 1927 Gross per 24 hour  Intake 200 ml  Output -  Net 200 ml   Filed Weights   04/17/2018 1328  Weight: 49.9 kg    Examination: General: somnolent, comfortable respirations HENT: pupils equal round reactive to light Lungs: cta b/l Cardiovascular: sinus tachy, rr Abdomen: soft nt nd bs + Extremities: scattered papules on lower ext b/l  Neuro: withdraws to pain,  no focal deficits  GU: foley placed  Resolved Hospital Problem list     Assessment & Plan:  32 year old female with polysubstance abuse presents with severe thrombocytopenia, sepsis, pna  Neuro- withdraws to pain, no focal deficits.  Ct head 04/06/2018 normal - will monitor for any focal deficits given severe thrombocytopenia  cv- given 3L of ivf outside facility another 1L bolus on arrival.  bp 117/57, sinus tach in 130's.   - will continue ns @ 16050ml/hr  - will consider aline, central line if becomes hemodynamically unstable or needs access   resp - cxr right sided pna.  abg on arrival 7.4/30's/80's - will continue vancomycin, ceft, azithromycin for CAP/IVDU  GI- NPO  - will give protonix for gi ppx  GU-  - foley in place - will monitor UOP   Nephrology - elevaetd creatitine, may be dehydration - will give IVF NS @ 150 ml/hr   Hematology - thrombocytopenia of uncertain etiology.  Possible dic vs ttp.  Awaiting smear from .  - will get dic panel  - will get adamst13 level  - will get u preg r/o possible cause of secondary ttp  - will rx sepsis per above for cause of possible seconary ttp  - may need hematology consult   ID- wbc >30.  Possible septic emboli.  Febrile to 38.1  - will continue vanc, ceft, azithro for CAP - pending blood cx, urine culture - will get ct chest /abd/pelvis r/o other possible primary cause  -  will consider echo if bacteremia   Best practice:  Diet: npo Pain/Anxiety/Delirium protocol (if indicated): tylenol prn VAP protocol (if indicated): n/a DVT prophylaxis: not indicated given thrombo GI prophylaxis: protonix Glucose control: ssi Mobility: bed rest Code Status: full code Family Communication:  No family avalable  Disposition:   Labs   CBC: Recent Labs  Lab 2017/11/16 1425  WBC 36.4*  NEUTROABS 32.3*  HGB 13.0  HCT 39.8  MCV 85.8  PLT 8*    Basic Metabolic Panel: Recent Labs  Lab 2017/11/16 1425  NA 133*  K 3.2*    CL 98  CO2 19*  GLUCOSE 126*  BUN 116*  CREATININE 2.42*  CALCIUM 7.7*   GFR: Estimated Creatinine Clearance: 26.8 mL/min (A) (by C-G formula based on SCr of 2.42 mg/dL (H)). Recent Labs  Lab 2017/11/16 1425 2017/11/16 1430 2017/11/16 1631  PROCALCITON 16.86  --   --   WBC 36.4*  --   --   LATICACIDVEN  --  5.12* 4.10*    Liver Function Tests: Recent Labs  Lab 2017/11/16 1425  AST 53*  ALT 35  ALKPHOS 139*  BILITOT 6.8*  PROT 5.8*  ALBUMIN 2.1*   No results for input(s): LIPASE, AMYLASE in the last 168 hours. Recent Labs  Lab 2017/11/16 1425  AMMONIA 34    ABG    Component Value Date/Time   PHART 7.413 01-Mar-2018 2119   PCO2ART 25.2 (L) 01-Mar-2018 2119   PO2ART 94.0 01-Mar-2018 2119   HCO3 15.9 (L) 01-Mar-2018 2119   TCO2 17 (L) 01-Mar-2018 2119   ACIDBASEDEF 7.0 (H) 01-Mar-2018 2119   O2SAT 97.0 01-Mar-2018 2119     Coagulation Profile: Recent Labs  Lab 2017/11/16 1425  INR 1.30    Cardiac Enzymes: No results for input(s): CKTOTAL, CKMB, CKMBINDEX, TROPONINI in the last 168 hours.  HbA1C: No results found for: HGBA1C  CBG: Recent Labs  Lab 2017/11/16 2111  GLUCAP 103*    Review of Systems:     Past Medical History  She,  has a past medical history of Anxiety, Bipolar 1 disorder (HCC), Depression, Drug abuse (HCC), and Pregnant.   Surgical History    Past Surgical History:  Procedure Laterality Date  . CESAREAN SECTION       Social History   reports that she has been smoking cigarettes. She has been smoking about 1.00 pack per day. She has never used smokeless tobacco. She reports current alcohol use. She reports current drug use. Drugs: IV and Methamphetamines.   Family History   Her family history is not on file.   Allergies No Known Allergies   Home Medications  Prior to Admission medications   Medication Sig Start Date End Date Taking? Authorizing Provider  ondansetron (ZOFRAN ODT) 4 MG disintegrating tablet Take 1 tablet (4 mg total)  by mouth every 8 (eight) hours as needed for nausea or vomiting. 11/26/17   Don PerkingVeronese, [place], MD  QUEtiapine (SEROQUEL) 50 MG tablet Take 1 tablet (50 mg total) by mouth at bedtime. 12/17/17   Sharyn CreamerQuale, [place], MD     Critical care time: 30 minutes

## 2018-04-14 NOTE — Progress Notes (Signed)
RT and charge RT attempted to place art line but was unsuccessful. MD and RN is aware.

## 2018-04-14 NOTE — Progress Notes (Addendum)
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 In jail x  8 days. Less responsive few days.    Vitals:   04/06/2018 1700 04/05/2018 1730 03/19/2018 1800 04/05/2018 1805  BP: (!) 105/41 (!) 117/54 103/75   Pulse: (!) 125 (!) 129 (!) 127   Resp: (!) 32 (!) 32 (!) 34   Temp:    97.8 F (36.6 C)  TempSrc:    Rectal  SpO2: 97% 97% 94%   Weight:      Height:        Borderline hypothermia GCS 12-13 - moaning/crying SIRS + - taachypneic, tachycardia but not on o2 and not paradoxical    LABS    PULMONARY No results for input(s): PHART, PCO2ART, PO2ART, HCO3, TCO2, O2SAT in the last 168 hours.  Invalid input(s): PCO2, PO2  CBC Recent Labs  Lab 04/02/2018 1425  HGB 13.0  HCT 39.8  WBC 36.4*  PLT 8*    COAGULATION Recent Labs  Lab 03/19/2018 1425  INR 1.30    CARDIAC  No results for input(s): TROPONINI in the last 168 hours. No results for input(s): PROBNP in the last 168 hours.   CHEMISTRY Recent Labs  Lab 04/13/2018 1425  NA 133*  K 3.2*  CL 98  CO2 19*  GLUCOSE 126*  BUN 116*  CREATININE 2.42*  CALCIUM 7.7*   Estimated Creatinine Clearance: 26.8 mL/min (A) (by C-G formula based on SCr of 2.42 mg/dL (H)).   LIVER Recent Labs  Lab 03/25/2018 1425  AST 53*  ALT 35  ALKPHOS 139*  BILITOT 6.8*  PROT 5.8*  ALBUMIN 2.1*  INR 1.30     INFECTIOUS Recent Labs  Lab 03/31/2018 1430 03/27/2018 1631  LATICACIDVEN 5.12* 4.10*     ENDOCRINE CBG (last 3)  No results for input(s): GLUCAP in the last 72 hours.       IMAGING x48h  - image(s) personally visualized  -   highlighted in bold Dg Chest 1 View  Result Date: 04/02/2018 CLINICAL DATA:  Sepsis. Heroin withdrawal. EXAM: CHEST  1 VIEW COMPARISON:  11/26/2017 FINDINGS: Normal heart size and mediastinal contours. Confluent opacities are noted in both upper lobes and to a greater extent in the right lower lobe without cavitation. Multilobar pneumonia might account for this or potentially a septic  emboli without cavitation. No effusion or edema. No bone destruction or fracture. IMPRESSION: Confluent opacities in both upper lobes and to a greater extent in the right lower lobe without cavitation. Multilobar pneumonia might account for this or potentially non cavitary septic emboli given patient history. Electronically Signed   By: Tollie Ethavid  Kwon M.D.   On: 04/10/2018 15:30   Ct Head Wo Contrast  Result Date: 03/28/2018 CLINICAL DATA:  Patient detoxing from heroin. Altered mental status EXAM: CT HEAD WITHOUT CONTRAST TECHNIQUE: Contiguous axial images were obtained from the base of the skull through the vertex without intravenous contrast. COMPARISON:  None. FINDINGS: Brain: Ventricles and sulci are appropriate for patient's age. No evidence for acute cortically based infarct, intracranial hemorrhage, mass lesion or mass-effect. Vascular: Unremarkable Skull: Intact Sinuses/Orbits: Paranasal sinuses are well aerated. Mastoid air cells unremarkable. Orbits are unremarkable. Other: None. IMPRESSION: No acute intracranial process. Electronically Signed   By: Annia Beltrew  Davis M.D.   On: 03/18/2018 15:21   CXr with infiltrates   ICU CAP Admission Criteria   Minor Criteria   RR >/= 30 or need for mechanical ventilation yes  PF ratio </= 250 no  Multilobar  infiltrates yes  Confusion/Disorientation/Acute delirium - hypo/hyper active yes  WC </= 4K no  Platelet count </= 100K yes  Hypothermia <36C no  Hypotension (even needing lot of fluids) no  Total Score for Minor Criteria   MAJOR CRITERIA   Septic shock - need for vasopressors no  Mechanical Ventilation (even non-invasive) no  Total score for Major Criteria    Admit ICU if 3 minor or 1 major (Level 2 ATS rec) 4 minor criteria     A IVDA Jail SIRS -  Tachycardia, tachypnea but not paradoxical  Lactic acodisis AKI CXR with infiltrates - 99% on RA Jaundicced  Rule out TTP - jaundice, mental status change, low platelets, renal  failure  P ICU admit - basd on 4 minor ATS pneumonia criteria Recommended - DIC panel, peripheral smear, HIV, Procal, RVP, urine strep, urine legionella,        SIGNATURE    Dr. Kalman ShanMurali Vashti Bolanos, M.D., F.C.C.P,  Pulmonary and Critical Care Medicine Staff Physician, [facility] Center Director - Interstitial Lung Disease  Program  Pulmonary Fibrosis [facility] at [facility] [place], [place], [id number]  Pager: [phone], If no answer or between  15:00h - 7:00h: call [phone] Telephone: [phone]  6:20 PM March 03, 2018

## 2018-04-15 ENCOUNTER — Inpatient Hospital Stay (HOSPITAL_COMMUNITY): Payer: Medicaid Other

## 2018-04-15 ENCOUNTER — Inpatient Hospital Stay: Payer: Self-pay

## 2018-04-15 DIAGNOSIS — R6521 Severe sepsis with septic shock: Secondary | ICD-10-CM

## 2018-04-15 DIAGNOSIS — B9561 Methicillin susceptible Staphylococcus aureus infection as the cause of diseases classified elsewhere: Secondary | ICD-10-CM

## 2018-04-15 DIAGNOSIS — F1990 Other psychoactive substance use, unspecified, uncomplicated: Secondary | ICD-10-CM

## 2018-04-15 DIAGNOSIS — Z9911 Dependence on respirator [ventilator] status: Secondary | ICD-10-CM

## 2018-04-15 DIAGNOSIS — I079 Rheumatic tricuspid valve disease, unspecified: Secondary | ICD-10-CM

## 2018-04-15 DIAGNOSIS — N179 Acute kidney failure, unspecified: Secondary | ICD-10-CM

## 2018-04-15 DIAGNOSIS — D696 Thrombocytopenia, unspecified: Secondary | ICD-10-CM

## 2018-04-15 DIAGNOSIS — F1590 Other stimulant use, unspecified, uncomplicated: Secondary | ICD-10-CM

## 2018-04-15 DIAGNOSIS — A419 Sepsis, unspecified organism: Secondary | ICD-10-CM

## 2018-04-15 DIAGNOSIS — F319 Bipolar disorder, unspecified: Secondary | ICD-10-CM

## 2018-04-15 DIAGNOSIS — I2690 Septic pulmonary embolism without acute cor pulmonale: Secondary | ICD-10-CM

## 2018-04-15 DIAGNOSIS — R7881 Bacteremia: Secondary | ICD-10-CM

## 2018-04-15 DIAGNOSIS — F17210 Nicotine dependence, cigarettes, uncomplicated: Secondary | ICD-10-CM

## 2018-04-15 LAB — BLOOD CULTURE ID PANEL (REFLEXED)
Acinetobacter baumannii: NOT DETECTED
CANDIDA PARAPSILOSIS: NOT DETECTED
CANDIDA TROPICALIS: NOT DETECTED
Candida albicans: NOT DETECTED
Candida glabrata: NOT DETECTED
Candida krusei: NOT DETECTED
ESCHERICHIA COLI: NOT DETECTED
Enterobacter cloacae complex: NOT DETECTED
Enterobacteriaceae species: NOT DETECTED
Enterococcus species: NOT DETECTED
Haemophilus influenzae: NOT DETECTED
KLEBSIELLA OXYTOCA: NOT DETECTED
Klebsiella pneumoniae: NOT DETECTED
Listeria monocytogenes: NOT DETECTED
Methicillin resistance: NOT DETECTED
Neisseria meningitidis: NOT DETECTED
Proteus species: NOT DETECTED
Pseudomonas aeruginosa: NOT DETECTED
Serratia marcescens: NOT DETECTED
Staphylococcus aureus (BCID): DETECTED — AB
Staphylococcus species: DETECTED — AB
Streptococcus agalactiae: NOT DETECTED
Streptococcus pneumoniae: NOT DETECTED
Streptococcus pyogenes: NOT DETECTED
Streptococcus species: NOT DETECTED

## 2018-04-15 LAB — GLUCOSE, CAPILLARY
GLUCOSE-CAPILLARY: 105 mg/dL — AB (ref 70–99)
Glucose-Capillary: 97 mg/dL (ref 70–99)
Glucose-Capillary: 110 mg/dL — ABNORMAL HIGH (ref 70–99)

## 2018-04-15 LAB — POCT I-STAT 3, ART BLOOD GAS (G3+)
Acid-base deficit: 6 mmol/L — ABNORMAL HIGH (ref 0.0–2.0)
Bicarbonate: 19.1 mmol/L — ABNORMAL LOW (ref 20.0–28.0)
O2 Saturation: 96 %
TCO2: 20 mmol/L — ABNORMAL LOW (ref 22–32)
pCO2 arterial: 33.9 mmHg (ref 32.0–48.0)
pH, Arterial: 7.362 (ref 7.350–7.450)
pO2, Arterial: 84 mmHg (ref 83.0–108.0)

## 2018-04-15 LAB — COMPREHENSIVE METABOLIC PANEL
ALT: 29 U/L (ref 0–44)
AST: 52 U/L — ABNORMAL HIGH (ref 15–41)
Albumin: 1.5 g/dL — ABNORMAL LOW (ref 3.5–5.0)
Alkaline Phosphatase: 129 U/L — ABNORMAL HIGH (ref 38–126)
Anion gap: 14 (ref 5–15)
BUN: 97 mg/dL — ABNORMAL HIGH (ref 6–20)
CO2: 16 mmol/L — ABNORMAL LOW (ref 22–32)
Calcium: 6.9 mg/dL — ABNORMAL LOW (ref 8.9–10.3)
Chloride: 109 mmol/L (ref 98–111)
Creatinine, Ser: 1.91 mg/dL — ABNORMAL HIGH (ref 0.44–1.00)
GFR calc Af Amer: 40 mL/min — ABNORMAL LOW (ref >60)
GFR calc non Af Amer: 35 mL/min — ABNORMAL LOW (ref >60)
Glucose, Bld: 85 mg/dL (ref 70–99)
Potassium: 3.9 mmol/L (ref 3.5–5.1)
Sodium: 139 mmol/L (ref 135–145)
Total Bilirubin: 5.7 mg/dL — ABNORMAL HIGH (ref 0.3–1.2)
Total Protein: 4.6 g/dL — ABNORMAL LOW (ref 6.5–8.1)

## 2018-04-15 LAB — RESPIRATORY PANEL BY PCR
Adenovirus: NOT DETECTED
BORDETELLA PERTUSSIS-RVPCR: NOT DETECTED
Chlamydophila pneumoniae: NOT DETECTED
Coronavirus 229E: NOT DETECTED
Coronavirus HKU1: NOT DETECTED
Coronavirus NL63: NOT DETECTED
Coronavirus OC43: NOT DETECTED
INFLUENZA A-RVPPCR: NOT DETECTED
INFLUENZA B-RVPPCR: NOT DETECTED
Metapneumovirus: NOT DETECTED
Mycoplasma pneumoniae: NOT DETECTED
PARAINFLUENZA VIRUS 4-RVPPCR: NOT DETECTED
Parainfluenza Virus 1: NOT DETECTED
Parainfluenza Virus 2: NOT DETECTED
Parainfluenza Virus 3: NOT DETECTED
Respiratory Syncytial Virus: NOT DETECTED
Rhinovirus / Enterovirus: NOT DETECTED

## 2018-04-15 LAB — DIC (DISSEMINATED INTRAVASCULAR COAGULATION)PANEL
D-Dimer, Quant: 19.23 ug/mL-FEU — ABNORMAL HIGH (ref 0.00–0.50)
INR: 1.61
Platelets: 12 10*3/uL — CL (ref 150–400)
Prothrombin Time: 18.9 seconds — ABNORMAL HIGH (ref 11.4–15.2)
Smear Review: NONE SEEN
aPTT: 36 seconds (ref 24–36)

## 2018-04-15 LAB — TECHNOLOGIST SMEAR REVIEW

## 2018-04-15 LAB — BASIC METABOLIC PANEL
Anion gap: 13 (ref 5–15)
BUN: 80 mg/dL — ABNORMAL HIGH (ref 6–20)
CO2: 17 mmol/L — ABNORMAL LOW (ref 22–32)
Calcium: 7.3 mg/dL — ABNORMAL LOW (ref 8.9–10.3)
Chloride: 114 mmol/L — ABNORMAL HIGH (ref 98–111)
Creatinine, Ser: 1.62 mg/dL — ABNORMAL HIGH (ref 0.44–1.00)
GFR calc Af Amer: 49 mL/min — ABNORMAL LOW (ref >60)
GFR calc non Af Amer: 42 mL/min — ABNORMAL LOW (ref >60)
Glucose, Bld: 106 mg/dL — ABNORMAL HIGH (ref 70–99)
Potassium: 3.2 mmol/L — ABNORMAL LOW (ref 3.5–5.1)
Sodium: 144 mmol/L (ref 135–145)

## 2018-04-15 LAB — CBC WITH DIFFERENTIAL/PLATELET
Abs Immature Granulocytes: 1.88 10*3/uL — ABNORMAL HIGH (ref 0.00–0.07)
Basophils Absolute: 0.1 10*3/uL (ref 0.0–0.1)
Basophils Relative: 0 %
Eosinophils Absolute: 0 10*3/uL (ref 0.0–0.5)
Eosinophils Relative: 0 %
HCT: 31.2 % — ABNORMAL LOW (ref 36.0–46.0)
Hemoglobin: 10.4 g/dL — ABNORMAL LOW (ref 12.0–15.0)
Immature Granulocytes: 5 %
Lymphocytes Relative: 4 %
Lymphs Abs: 1.3 10*3/uL (ref 0.7–4.0)
MCH: 28.6 pg (ref 26.0–34.0)
MCHC: 33.3 g/dL (ref 30.0–36.0)
MCV: 85.7 fL (ref 80.0–100.0)
Monocytes Absolute: 1.6 10*3/uL — ABNORMAL HIGH (ref 0.1–1.0)
Monocytes Relative: 5 %
Neutro Abs: 30.1 10*3/uL — ABNORMAL HIGH (ref 1.7–7.7)
Neutrophils Relative %: 86 %
Platelets: 7 10*3/uL — CL (ref 150–400)
RBC: 3.64 MIL/uL — ABNORMAL LOW (ref 3.87–5.11)
RDW: 14.6 % (ref 11.5–15.5)
WBC Morphology: INCREASED
WBC: 35 10*3/uL — ABNORMAL HIGH (ref 4.0–10.5)
nRBC: 0.1 % (ref 0.0–0.2)

## 2018-04-15 LAB — APTT: aPTT: 32 seconds (ref 24–36)

## 2018-04-15 LAB — CBC
HCT: 31.4 % — ABNORMAL LOW (ref 36.0–46.0)
Hemoglobin: 10.2 g/dL — ABNORMAL LOW (ref 12.0–15.0)
MCH: 28.2 pg (ref 26.0–34.0)
MCHC: 32.5 g/dL (ref 30.0–36.0)
MCV: 86.7 fL (ref 80.0–100.0)
Platelets: 21 10*3/uL — CL (ref 150–400)
RBC: 3.62 MIL/uL — ABNORMAL LOW (ref 3.87–5.11)
RDW: 14.7 % (ref 11.5–15.5)
WBC: 28.2 10*3/uL — ABNORMAL HIGH (ref 4.0–10.5)
nRBC: 0.1 % (ref 0.0–0.2)

## 2018-04-15 LAB — DIC (DISSEMINATED INTRAVASCULAR COAGULATION) PANEL: FIBRINOGEN: 180 mg/dL — AB (ref 210–475)

## 2018-04-15 LAB — PHOSPHORUS
Phosphorus: 4.1 mg/dL (ref 2.5–4.6)
Phosphorus: 4.8 mg/dL — ABNORMAL HIGH (ref 2.5–4.6)
Phosphorus: 4.8 mg/dL — ABNORMAL HIGH (ref 2.5–4.6)

## 2018-04-15 LAB — LACTIC ACID, PLASMA
Lactic Acid, Venous: 3.8 mmol/L (ref 0.5–1.9)
Lactic Acid, Venous: 3.9 mmol/L (ref 0.5–1.9)

## 2018-04-15 LAB — STREP PNEUMONIAE URINARY ANTIGEN: Strep Pneumo Urinary Antigen: NEGATIVE

## 2018-04-15 LAB — ABO/RH: ABO/RH(D): O NEG

## 2018-04-15 LAB — TROPONIN I
Troponin I: 0.12 ng/mL (ref <0.03)
Troponin I: 0.14 ng/mL (ref <0.03)
Troponin I: 0.15 ng/mL (ref <0.03)

## 2018-04-15 LAB — MAGNESIUM
Magnesium: 2.5 mg/dL — ABNORMAL HIGH (ref 1.7–2.4)
Magnesium: 2.5 mg/dL — ABNORMAL HIGH (ref 1.7–2.4)
Magnesium: 2.6 mg/dL — ABNORMAL HIGH (ref 1.7–2.4)

## 2018-04-15 LAB — CORTISOL: Cortisol, Plasma: 48.4 ug/dL

## 2018-04-15 LAB — LIPASE, BLOOD: Lipase: 17 U/L (ref 11–51)

## 2018-04-15 LAB — AMYLASE: Amylase: 8 U/L — ABNORMAL LOW (ref 28–100)

## 2018-04-15 LAB — PROTIME-INR
INR: 1.45
Prothrombin Time: 17.5 seconds — ABNORMAL HIGH (ref 11.4–15.2)

## 2018-04-15 LAB — PROCALCITONIN: Procalcitonin: 9.42 ng/mL

## 2018-04-15 LAB — FIBRINOGEN: Fibrinogen: 206 mg/dL — ABNORMAL LOW (ref 210–475)

## 2018-04-15 LAB — BRAIN NATRIURETIC PEPTIDE: B Natriuretic Peptide: 201.6 pg/mL — ABNORMAL HIGH (ref 0.0–100.0)

## 2018-04-15 LAB — LACTATE DEHYDROGENASE: LDH: 322 U/L — ABNORMAL HIGH (ref 98–192)

## 2018-04-15 MED ORDER — FENTANYL 2500MCG IN NS 250ML (10MCG/ML) PREMIX INFUSION: 0.0000 ug/h | INTRAVENOUS | Status: DC

## 2018-04-15 MED ORDER — LACTATED RINGERS IV BOLUS
1000.0000 mL | Freq: Once | INTRAVENOUS | Status: AC
  Administered 2018-04-15: 1000 mL via INTRAVENOUS

## 2018-04-15 MED ORDER — ORAL CARE MOUTH RINSE
15.0000 mL | Freq: Two times a day (BID) | OROMUCOSAL | Status: DC
  Administered 2018-04-15 (×2): 15 mL via OROMUCOSAL

## 2018-04-15 MED ORDER — CEFAZOLIN SODIUM-DEXTROSE 2-4 GM/100ML-% IV SOLN
2.0000 g | Freq: Three times a day (TID) | INTRAVENOUS | Status: DC
  Administered 2018-04-15 – 2018-04-16 (×4): 2 g via INTRAVENOUS
  Filled 2018-04-15 (×6): qty 100

## 2018-04-15 MED ORDER — PRO-STAT SUGAR FREE PO LIQD
30.0000 mL | Freq: Two times a day (BID) | ORAL | Status: DC
  Administered 2018-04-15 – 2018-04-16 (×2): 30 mL
  Filled 2018-04-15 (×2): qty 30

## 2018-04-15 MED ORDER — ASPIRIN 300 MG RE SUPP
150.0000 mg | Freq: Every day | RECTAL | Status: DC
  Administered 2018-04-15: 150 mg via RECTAL
  Filled 2018-04-15: qty 1

## 2018-04-15 MED ORDER — FENTANYL CITRATE (PF) 100 MCG/2ML IJ SOLN
INTRAMUSCULAR | Status: AC
  Filled 2018-04-15: qty 2

## 2018-04-15 MED ORDER — CHLORHEXIDINE GLUCONATE 0.12% ORAL RINSE (MEDLINE KIT)
15.0000 mL | Freq: Two times a day (BID) | OROMUCOSAL | Status: DC
  Administered 2018-04-15 – 2018-04-16 (×2): 15 mL via OROMUCOSAL

## 2018-04-15 MED ORDER — ORAL CARE MOUTH RINSE
15.0000 mL | OROMUCOSAL | Status: DC
  Administered 2018-04-15 – 2018-04-16 (×4): 15 mL via OROMUCOSAL

## 2018-04-15 MED ORDER — VASOPRESSIN 20 UNIT/ML IV SOLN
0.0300 [IU]/min | INTRAVENOUS | Status: DC
  Administered 2018-04-15 – 2018-04-18 (×4): 0.03 [IU]/min via INTRAVENOUS
  Filled 2018-04-15 (×4): qty 2

## 2018-04-15 MED ORDER — FENTANYL CITRATE (PF) 100 MCG/2ML IJ SOLN: 50.0000 ug | Freq: Once | INTRAMUSCULAR | Status: AC

## 2018-04-15 MED ORDER — SODIUM CHLORIDE 0.9 % IV BOLUS
1000.0000 mL | Freq: Once | INTRAVENOUS | Status: AC
  Administered 2018-04-15: 1000 mL via INTRAVENOUS

## 2018-04-15 MED ORDER — DEXMEDETOMIDINE HCL IN NACL 400 MCG/100ML IV SOLN
0.0000 ug/kg/h | INTRAVENOUS | Status: DC
  Administered 2018-04-15: 1 ug/kg/h via INTRAVENOUS
  Filled 2018-04-15 (×2): qty 100

## 2018-04-15 MED ORDER — VANCOMYCIN HCL IN DEXTROSE 750-5 MG/150ML-% IV SOLN
750.0000 mg | INTRAVENOUS | Status: DC
  Administered 2018-04-15: 750 mg via INTRAVENOUS
  Filled 2018-04-15: qty 150

## 2018-04-15 MED ORDER — FENTANYL BOLUS VIA INFUSION
50.0000 ug | INTRAVENOUS | Status: DC | PRN
  Administered 2018-04-15 – 2018-04-16 (×3): 50 ug via INTRAVENOUS
  Filled 2018-04-15: qty 50

## 2018-04-15 MED ORDER — SODIUM CHLORIDE 0.9 % IV SOLN
1.2500 ng/kg/min | INTRAVENOUS | Status: AC
  Administered 2018-04-15: 5 ng/kg/min via INTRAVENOUS
  Administered 2018-04-16: 40 ng/kg/min via INTRAVENOUS
  Filled 2018-04-15 (×2): qty 1

## 2018-04-15 MED ORDER — ETOMIDATE 2 MG/ML IV SOLN
20.0000 mg | Freq: Once | INTRAVENOUS | Status: AC
  Administered 2018-04-15: 20 mg via INTRAVENOUS

## 2018-04-15 MED ORDER — FENTANYL CITRATE (PF) 100 MCG/2ML IJ SOLN
100.0000 ug | Freq: Once | INTRAMUSCULAR | Status: AC
  Administered 2018-04-15: 100 ug via INTRAVENOUS

## 2018-04-15 MED ORDER — FENTANYL 2500MCG IN NS 250ML (10MCG/ML) PREMIX INFUSION
25.0000 ug/h | INTRAVENOUS | Status: DC
  Administered 2018-04-15: 100 ug/h via INTRAVENOUS
  Administered 2018-04-16: 225 ug/h via INTRAVENOUS
  Filled 2018-04-15 (×3): qty 250

## 2018-04-15 MED ORDER — LACTATED RINGERS IV SOLN
INTRAVENOUS | Status: DC
  Administered 2018-04-15 (×2): via INTRAVENOUS

## 2018-04-15 MED ORDER — CHLORHEXIDINE GLUCONATE 0.12 % MT SOLN
15.0000 mL | Freq: Two times a day (BID) | OROMUCOSAL | Status: DC
  Administered 2018-04-15: 15 mL via OROMUCOSAL

## 2018-04-15 MED ORDER — ROCURONIUM BROMIDE 50 MG/5ML IV SOLN
50.0000 mg | Freq: Once | INTRAVENOUS | Status: AC
  Administered 2018-04-15: 50 mg via INTRAVENOUS
  Filled 2018-04-15: qty 5

## 2018-04-15 MED ORDER — LACTATED RINGERS IV BOLUS
1000.0000 mL | INTRAVENOUS | Status: AC
  Administered 2018-04-15 (×2): 1000 mL via INTRAVENOUS

## 2018-04-15 MED ORDER — VITAL HIGH PROTEIN PO LIQD
1000.0000 mL | ORAL | Status: DC
  Administered 2018-04-15 – 2018-04-16 (×2): 1000 mL

## 2018-04-15 MED ORDER — "THROMBI-PAD 3""X3"" EX PADS"
1.0000 | MEDICATED_PAD | Freq: Once | CUTANEOUS | Status: AC
  Administered 2018-04-15: 1 via TOPICAL
  Filled 2018-04-15: qty 1

## 2018-04-15 MED ORDER — PIPERACILLIN-TAZOBACTAM 3.375 G IVPB
3.3750 g | Freq: Three times a day (TID) | INTRAVENOUS | Status: DC
  Filled 2018-04-15: qty 50

## 2018-04-15 MED ORDER — MIDAZOLAM HCL 2 MG/2ML IJ SOLN
INTRAMUSCULAR | Status: AC
  Filled 2018-04-15: qty 2

## 2018-04-15 MED ORDER — RHO D IMMUNE GLOBULIN 1500 UNIT/2ML IJ SOSY
300.0000 ug | PREFILLED_SYRINGE | Freq: Once | INTRAMUSCULAR | Status: AC
  Administered 2018-04-15: 300 ug via INTRAVENOUS
  Filled 2018-04-15: qty 2

## 2018-04-15 MED ORDER — NOREPINEPHRINE BITARTRATE 1 MG/ML IV SOLN
0.0000 ug/min | INTRAVENOUS | Status: DC
  Administered 2018-04-15: 16 ug/min via INTRAVENOUS
  Administered 2018-04-15: 5 ug/min via INTRAVENOUS
  Administered 2018-04-16: 30 ug/min via INTRAVENOUS
  Filled 2018-04-15 (×4): qty 4

## 2018-04-15 MED ORDER — MIDAZOLAM HCL 2 MG/2ML IJ SOLN
2.0000 mg | Freq: Once | INTRAMUSCULAR | Status: AC
  Administered 2018-04-15: 2 mg via INTRAVENOUS

## 2018-04-15 NOTE — Progress Notes (Signed)
Patient platelets are critically low, however, she is Rh negative but blood bank does not have Rh negative platelets supplied. Blood bank had to call different facilities to find them. Dr. Lucinda DellEltaraboulsi is aware of this and  ordered to give Rh positive platelets now and administer Rhogam before giving the Rh negative platelets once they arrive. Will continue to monitor throughout shift.

## 2018-04-15 NOTE — Progress Notes (Signed)
eLink Physician-Brief Progress Note Patient Name: Normajean GlasgowBertha M XXXSutton DOB: 11-Sep-1985 MRN: [id number]   Date of Service  04/15/2018  HPI/Events of Note  Notified of bradycardia after being turned.  BP unchanged but remains on 3 pressors with no increase in dose. On Fentanyl 200 and Precedex 0.5  eICU Interventions  Hold Precedex. May resume Fentanyl at 100. 2D echo pending     Intervention Category Major Interventions: Arrhythmia - evaluation and management  Darl Pikesmily T Zacherie Honeyman 04/15/2018, 9:18 PM

## 2018-04-15 NOTE — Progress Notes (Signed)
NAME:  Normajean GlasgowBertha M XXXSutton, MRN:  [id number], DOB:  [birth year], LOS: 1 ADMISSION DATE:  09/01/17, CONSULTATION DATE:  05-12-2017 [facility]FERRING MD[facility]  [facility], CHIEF COMPLAINT:  Sepsis/thrombcytopenia   Brief History   32 year old female tx from [place] for sepsis, thrombocytopenia, TTP?  History of present illness   32 year old female history of substance abuse incarcerated for 8 days less responsive over the last few days. Brought to [place] 05-12-2017.  Found somnolent, hypotensive, lactate 5.  cxr showed PNA.  Cbc showed plt 8.  Concern for TTP, sepsis.  Treated with iv abx vancomycine, ceftriaxone, azithromycin 05-12-2017.  tx to [place] for ICU management.    On presentation, patient is somnolent, but withdraws to pain.  Tachycardic on admission.    Past Medical History  Polysubstance abuse   Significant Hospital Events     Consults:    Procedures:    Significant Diagnostic Tests:  Lactate 5 05-12-2017 CXR right sided pna 05-12-2017  Micro Data:  Blood cx 05-12-2017 [facility] Urine cx 05-12-2017 [facility]  Antimicrobials:  Vanc 05-12-2017>>> Ceftriaxone 05-12-2017>>>12/29 Azithromycine 05-12-2017>>>12/29 Zosyn 12/29>>>  Interim history/subjective:  No events overnight Unresponsive this AM  Objective   Blood pressure (!) 108/50, pulse (!) 120, temperature 98.8 F (37.1 C), resp. rate (!) 27, height 5\' 2"  (1.575 m), weight 52 kg, SpO2 93 %, unknown if currently breastfeeding.        Intake/Output Summary (Last 24 hours) at 04/15/2018 0916 Last data filed at 04/15/2018 0542 Gross per 24 hour  Intake 978.78 ml  Output 1392 ml  Net -413.22 ml   Filed Weights   05-12-2017 1328 04/15/18 0500  Weight: 49.9 kg 52 kg   Examination: General: Somnolent but alert and following command HENT: [place]/AT, PERRL, EOM-I and MMM Lungs: Bibasilar crackles Cardiovascular: RRR, Nl S1/S2 and -M/R/G Abdomen: Soft, NT, ND and +BS Extremities: -edema and -tenderness, left toes are  erythematous and swollen Neuro: Arousable, moving all ext to command GU: foley placed Skin: Intact  Resolved Hospital Problem list     Assessment & Plan:  32 year old female with history of IVDA with AMS and respiratory failure.  I suspect has endocarditis and TTP.  I reviewed CT of the chest with likely septic emboli.  Discussed with H/O.  VDRD:  - Intubate  - Full vent support  - ABG post intubation  - CXR post intubation  - CXR and ABG in AM  - Titrate O2 for sat of 88-92%  - Treat infection  Endocarditis: suspected  - Change abx to vanc/zosyn  - D/C rocephin and zithromax  - F/u on culture  AMS: likely due to sepsis, neck is not stiff and CT is negative, do not suspect meningitis  - Fentanyl drip  - Precedex drip  - Monitor clinically  - Abx as above  ARF: likely due to sepsis vs septic emboli  - IVF hydration  - BMET in AM  - Replace electrolytes as indicated  ?vascular compromise  - Vascular surgery to evaluate  - Arterial dopplers ordered  - Unable to heparanize due to low platelet  Thrombocytopenia: ?TTP  - DIC panel  - Hematology consult  - Peripheral smear  Labs   CBC: Recent Labs  Lab 05-12-2017 1425 05-12-2017 2333  WBC 36.4* 35.0*  NEUTROABS 32.3* 30.1*  HGB 13.0 10.4*  HCT 39.8 31.2*  MCV 85.8 85.7  PLT 8* 7*    Basic Metabolic Panel: Recent Labs  Lab 05-12-2017  1425 03/24/2018 2333  NA 133* 139  K 3.2* 3.9  CL 98 109  CO2 19* 16*  GLUCOSE 126* 85  BUN 116* 97*  CREATININE 2.42* 1.91*  CALCIUM 7.7* 6.9*  MG  --  2.5*  PHOS  --  4.1   GFR: Estimated Creatinine Clearance: 34.1 mL/min (A) (by C-G formula based on SCr of 1.91 mg/dL (H)). Recent Labs  Lab 04/11/2018 1425 04/17/2018 1430 03/25/2018 1631 03/18/2018 2333 04/15/18 0250  PROCALCITON 16.86  --   --  9.42  --   WBC 36.4*  --   --  35.0*  --   LATICACIDVEN  --  5.12* 4.10* 3.9* 3.8*    Liver Function Tests: Recent Labs  Lab 04/15/2018 1425 04/01/2018 2333  AST 53* 52*  ALT  35 29  ALKPHOS 139* 129*  BILITOT 6.8* 5.7*  PROT 5.8* 4.6*  ALBUMIN 2.1* 1.5*   Recent Labs  Lab 04/13/2018 2333  LIPASE 17  AMYLASE 8*   Recent Labs  Lab 04/15/2018 1425  AMMONIA 34    ABG    Component Value Date/Time   PHART 7.413 04/03/2018 2119   PCO2ART 25.2 (L) 04/07/2018 2119   PO2ART 94.0 04/13/2018 2119   HCO3 15.9 (L) 04/10/2018 2119   TCO2 17 (L) 04/13/2018 2119   ACIDBASEDEF 7.0 (H) 04/16/2018 2119   O2SAT 97.0 04/05/2018 2119     Coagulation Profile: Recent Labs  Lab 03/20/2018 1425 04/17/2018 2333  INR 1.30 1.45    Cardiac Enzymes: Recent Labs  Lab 04/02/2018 2333 04/15/18 0250  TROPONINI 0.15* 0.14*    HbA1C: No results found for: HGBA1C  CBG: Recent Labs  Lab 04/03/2018 2111  GLUCAP 103*    Review of Systems:     Past Medical History  She,  has a past medical history of Anxiety, Bipolar 1 disorder (HCC), Depression, Drug abuse (HCC), and Pregnant.   Surgical History    Past Surgical History:  Procedure Laterality Date  . CESAREAN SECTION       Social History   reports that she has been smoking cigarettes. She has been smoking about 1.00 pack per day. She has never used smokeless tobacco. She reports current alcohol use. She reports current drug use. Drugs: IV and Methamphetamines.   Family History   Her family history is not on file.   Allergies No Known Allergies   Home Medications  Prior to Admission medications   Medication Sig Start Date End Date Taking? Authorizing Provider  ondansetron (ZOFRAN ODT) 4 MG disintegrating tablet Take 1 tablet (4 mg total) by mouth every 8 (eight) hours as needed for nausea or vomiting. 11/26/17   Don PerkingVeronese, [place], MD  QUEtiapine (SEROQUEL) 50 MG tablet Take 1 tablet (50 mg total) by mouth at bedtime. 12/17/17   Sharyn CreamerQuale, Mark, MD    The patient is critically ill with multiple organ systems failure and requires high complexity decision making for assessment and support, frequent evaluation and  titration of therapies, application of advanced monitoring technologies and extensive interpretation of multiple databases.   Critical Care Time devoted to patient care services described in this note is  45  Minutes. This time reflects time of care of this signee Dr Koren BoundWesam Monic Engelmann. This critical care time does not reflect procedure time, or teaching time or supervisory time of PA/NP/Med student/Med Resident etc but could involve care discussion time.  Alyson ReedyWesam G. Mikele Sifuentes, M.D. [facility] Pulmonary/Critical Care Medicine. Pager: [phone]. After hours pager: [phone].

## 2018-04-15 NOTE — Progress Notes (Signed)
Brief Nutrition Note  Consult received for enteral/tube feeding initiation and management.  Adult Enteral Nutrition Protocol initiated. Full assessment to follow.  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 Pt meets criteria for normal based on current BMI.  Labs:  Recent Labs  Lab 2017/08/07 1425 2017/08/07 2333 04/15/18 0820  NA 133* 139 144  K 3.2* 3.9 3.2*  CL 98 109 114*  CO2 19* 16* 17*  BUN 116* 97* 80*  CREATININE 2.42* 1.91* 1.62*  CALCIUM 7.7* 6.9* 7.3*  MG  --  2.5*  --   PHOS  --  4.1  --   GLUCOSE 126* 85 106*    Tilda FrancoLindsey Haik Mahoney, MS, RD, LDN [facility] Clinical Dietitian Pager: [phone] After Hours Pager: [phone]

## 2018-04-15 NOTE — Progress Notes (Signed)
RUE with swelling noted.  R hand with redness present and wounds noted. Will avoid this extremity for PICC placement due to infectious process potential.  LUE assessed for PICC placement by Jani FilesJohn Thomasson and myself.  All vessels measured 50-100% PICC occupancy.  BP also low at this time, arteries also compressible, unable to distinguish veins and arteries.  Dr Molli KnockYacoub notified by Kennyth Loseobin RN.  Orders for IVF bolus and to reassess at later rime.

## 2018-04-15 NOTE — Procedures (Signed)
Arterial Catheter Insertion Procedure Note Normajean GlasgowBertha M XXXSutton [id number] 1986-02-24  Procedure: Insertion of Arterial Catheter  Indications: Blood pressure monitoring and Frequent blood sampling  Procedure Details Consent: Unable to obtain consent because of emergent medical necessity. Time Out: Verified patient identification, verified procedure, site/side was marked, verified correct patient position, special equipment/implants available, medications/allergies/relevent history reviewed, required imaging and test results available.  Performed  Maximum sterile technique was used including antiseptics, cap, gloves, gown, hand hygiene, mask and sheet. Skin prep: Chlorhexidine; local anesthetic administered 20 gauge catheter was inserted into right radial artery using the Seldinger technique. ULTRASOUND GUIDANCE USED: NO   Evaluation Blood flow good; BP tracing good. Complications: No apparent complications.   Koren BoundYACOUB,WESAM 04/15/2018

## 2018-04-15 NOTE — Progress Notes (Signed)
PHARMACY - PHYSICIAN COMMUNICATION CRITICAL VALUE ALERT - BLOOD CULTURE IDENTIFICATION (BCID)  Frances Le is an 32 y.o. female who [facility] with a chief complaint of sepsis proabably endocarditis   Assessment:  MSSA in 2/2 blood cx  Name of physician (or Provider) Contacted: Dr Molli KnockYacoub, Dr Drue SecondSnider  Current antibiotics: Vanc and Zosyn  Changes to prescribed antibiotics recommended:  DC Vanc Zosyn Cefazolin 2 g q8  Results for orders placed or performed during the hospital encounter of 04/02/2018  Blood Culture ID Panel (Reflexed) (Collected: 04/10/2018  2:25 PM)  Result Value Ref Range   Enterococcus species NOT DETECTED NOT DETECTED   Listeria monocytogenes NOT DETECTED NOT DETECTED   Staphylococcus species DETECTED (A) NOT DETECTED   Staphylococcus aureus (BCID) DETECTED (A) NOT DETECTED   Methicillin resistance NOT DETECTED NOT DETECTED   Streptococcus species NOT DETECTED NOT DETECTED   Streptococcus agalactiae NOT DETECTED NOT DETECTED   Streptococcus pneumoniae NOT DETECTED NOT DETECTED   Streptococcus pyogenes NOT DETECTED NOT DETECTED   Acinetobacter baumannii NOT DETECTED NOT DETECTED   Enterobacteriaceae species NOT DETECTED NOT DETECTED   Enterobacter cloacae complex NOT DETECTED NOT DETECTED   Escherichia coli NOT DETECTED NOT DETECTED   Klebsiella oxytoca NOT DETECTED NOT DETECTED   Klebsiella pneumoniae NOT DETECTED NOT DETECTED   Proteus species NOT DETECTED NOT DETECTED   Serratia marcescens NOT DETECTED NOT DETECTED   Haemophilus influenzae NOT DETECTED NOT DETECTED   Neisseria meningitidis NOT DETECTED NOT DETECTED   Pseudomonas aeruginosa NOT DETECTED NOT DETECTED   Candida albicans NOT DETECTED NOT DETECTED   Candida glabrata NOT DETECTED NOT DETECTED   Candida krusei NOT DETECTED NOT DETECTED   Candida parapsilosis NOT DETECTED NOT DETECTED   Candida tropicalis NOT DETECTED NOT DETECTED   [name], PharmD, BCPS,  BCCCP Clinical Pharmacist [phone]  Please check AMION for all [facility] numbers  04/15/2018 12:44 PM

## 2018-04-15 NOTE — Progress Notes (Signed)
eLink Physician-Brief Progress Note Patient Name: Normajean GlasgowBertha M XXXSutton DOB: [birth year] MRN: [id number]   Date of Service  04/15/2018  HPI/Events of Note  Multiple issues: 1. Troponin = 0.15 and 2. Lactic Acid = 3.9.   eICU Interventions  Will order: 1. Bolus with 0.9 NaCl 1 liter IV over 1 hour now.  2. ASA suppository 150 mg PR now and Q day.  3. Continue to trend Lactic Acid level. 4. Continue to trend Troponin.      Intervention Category Major Interventions: Other:;Acid-Base disturbance - evaluation and management  Chante Mayson Eugene 04/15/2018, 2:52 AM

## 2018-04-15 NOTE — Procedures (Signed)
Intubation Procedure Note Frances Le [id number] 30-May-1985  Procedure: Intubation Indications: Respiratory insufficiency  Procedure Details Consent: Unable to obtain consent because of emergent medical necessity. Time Out: Verified patient identification, verified procedure, site/side was marked, verified correct patient position, special equipment/implants available, medications/allergies/relevent history reviewed, required imaging and test results available.  Performed  Maximum sterile technique was used including gloves, hand hygiene and mask.  MAC    Evaluation Hemodynamic Status: BP stable throughout; O2 sats: stable throughout Patient's Current Condition: stable Complications: No apparent complications Patient did tolerate procedure well. Chest X-ray ordered to verify placement.  CXR: pending.   Jennet Maduro 04/15/2018

## 2018-04-15 NOTE — Progress Notes (Signed)
Pt w/o contact information. Sherrif's deputy made contact with [place] sheriffs dept and with [place] dept to assess if pt had record of family or any contact. No contact has been discovered.

## 2018-04-15 NOTE — Consult Note (Signed)
[facility] [phone]   [phone]  CONSULT NOTE  REFERRING PHYSICIAN:Dr. Charlestine MassedWessam Yacoub  REASON FOR CONSULTATION:  32 years old white female with thrombocytopenia.  HPI Frances Le is a 32 y.o. female with past medical history significant for anxiety, bipolar disorder as well as history of drug abuse.  The patient was brought from jail where she was incarcerated for 8 days to the emergency department at [facility] with decreased responsiveness and possible toxic state from heroin withdrawal.  She had CT scan of the head without contrast that was unremarkable.  CBC showed significantly elevated white blood count of 36.4 with normal hemoglobin and hematocrit and low platelet count of 8000.  She had a blood culture that was positive for Staphylococcus species.  Comprehensive metabolic panel showed also elevated bilirubin of 6.8 in addition to elevated alkaline phosphatase and acute renal insufficiency with serum creatinine of 2.42.  Urinalysis showed elevated white blood count of 21-50.  Serum fibrinogen was low at 206.  She also had elevated PT/INR.  The patient was transferred [facility] for further evaluation and management of her condition.  She is currently under treatment by the critical care team.  She is sedated.  She was also seen by infectious disease for her infection.  CT scan of the chest, abdomen and pelvis performed earlier today showed scattered nodular airspace opacities bilaterally with larger airspace opacity in the right measuring 5.6 cm in size suspicious for diffuse septic emboli.  There was also underlying diffuse interstitial prominence with trace bilateral pleural effusion concerning for mild pulmonary edema there was also question of mild wall thickening at the cecum and proximal ascending colon which could reflect a mild infectious or inflammatory process the scan also showed mild diffuse mesenteric edema and  questionable of haziness about the left renal pelvis and mild left sided perinephric stranding concerning for left-sided pyelonephritis. I was consulted today to evaluate the patient regarding her thrombocytopenia.  CBC performed earlier today showed platelets count of 21,000. The patient is currently sedated and and responsive to verbal stimuli and could not obtained accurate history from her. HPI  Past Medical History:  Diagnosis Date  . Anxiety   . Bipolar 1 disorder (HCC)   . Depression   . Drug abuse (HCC)   . Pregnant     Past Surgical History:  Procedure Laterality Date  . CESAREAN SECTION      No family history on file.  Social History Social History   Tobacco Use  . Smoking status: Current Every Day Smoker    Packs/day: 1.00    Types: Cigarettes  . Smokeless tobacco: Never Used  Substance Use Topics  . Alcohol use: Yes  . Drug use: Yes    Types: IV, Methamphetamines    No Known Allergies  Current Facility-Administered Medications  Medication Dose Route Frequency Provider Last Rate Last Dose  . 0.9 %  sodium chloride infusion (Manually program via Guardrails IV Fluids)   Intravenous Once Mancel BaleWentz, Elliott, MD      . acetaminophen (TYLENOL) tablet 650 mg  650 mg Oral Q4H PRN Jearld LeschEltaraboulsi, Walid R, MD      . aspirin suppository 150 mg  150 mg Rectal Daily Karl ItoSommer, Steven E, MD      . ceFAZolin (ANCEF) IVPB 2g/100 mL premix  2 g Intravenous Q8H Alyson ReedyYacoub, Wesam G, MD 200 mL/hr at 04/15/18 1252 2 g at 04/15/18 1252  . chlorhexidine (PERIDEX) 0.12 % solution  15 mL  15 mL Mouth Rinse BID Kalman Shan, MD   15 mL at 04/15/18 0900  . dexmedetomidine (PRECEDEX) 400 MCG/100ML (4 mcg/mL) infusion  0-1.2 mcg/kg/hr Intravenous Continuous Alyson Reedy, MD 13 mL/hr at 04/15/18 1034 1 mcg/kg/hr at 04/15/18 1034  . feeding supplement (PRO-STAT SUGAR FREE 64) liquid 30 mL  30 mL Per Tube BID Alyson Reedy, MD      . feeding supplement (VITAL HIGH PROTEIN) liquid 1,000 mL  1,000  mL Per Tube Q24H Alyson Reedy, MD      . fentaNYL (SUBLIMAZE) bolus via infusion 50 mcg  50 mcg Intravenous Q1H PRN Alyson Reedy, MD      . fentaNYL in NS (63mcg/ml) infusion-PREMIX  25-400 mcg/hr Intravenous Continuous Alyson Reedy, MD 10 mL/hr at 04/15/18 1030 100 mcg/hr at 04/15/18 1030  . lactated ringers bolus 1,000 mL  1,000 mL Intravenous Q30 min Alyson Reedy, MD 1,935.5 mL/hr at 04/15/18 1235 1,000 mL at 04/15/18 1235  . lactated ringers infusion   Intravenous Continuous Alyson Reedy, MD 100 mL/hr at 04/15/18 1100    . MEDLINE mouth rinse  15 mL Mouth Rinse q12n4p Kalman Shan, MD   15 mL at 04/15/18 1257  . norepinephrine (LEVOPHED) 4 mg in dextrose 5 % 250 mL (0.016 mg/mL) infusion  0-40 mcg/min Intravenous Titrated Alyson Reedy, MD 18.75 mL/hr at 04/15/18 1140 5 mcg/min at 04/15/18 1140  . pantoprazole (PROTONIX) injection 40 mg  40 mg Intravenous QHS Jearld Lesch, MD   40 mg at 2018/05/12 2204    Review of Systems  Review of systems not obtained due to patient factors.  Physical Exam  WUJ:[id number] and sedated. SKIN: skin color, texture, turgor are normal, no rashes or significant lesions HEAD: Normocephalic, No masses, lesions, tenderness or abnormalities EYES: normal EARS: External ears normal NECK: supple, no adenopathy LYMPH:  no palpable lymphadenopathy, no hepatosplenomegaly BREAST:not examined LUNGS: scattered rales bilaterally HEART: regular rate & rhythm, no murmurs and no gallops ABDOMEN:abdomen soft, non-tender, normal bowel sounds and no masses or organomegaly BACK: No CVA tenderness EXTREMITIES:no edema  NEURO: Unable to assess.   LABORATORY DATA: Lab Results  Component Value Date   WBC 28.2 (H) 04/15/2018   HGB 10.2 (L) 04/15/2018   HCT 31.4 (L) 04/15/2018   MCV 86.7 04/15/2018   PLT 21 (LL) 04/15/2018    @LASTCHEM @  RADIOGRAPHIC STUDIES: Ct Abdomen Pelvis Wo Contrast  Result Date:  04/15/2018 CLINICAL DATA:  Acute onset of sepsis. EXAM: CT CHEST, ABDOMEN AND PELVIS WITHOUT CONTRAST TECHNIQUE: Multidetector CT imaging of the chest, abdomen and pelvis was performed following the standard protocol without IV contrast. COMPARISON:  CT of the abdomen and pelvis performed 11/26/2017, and chest radiograph performed 12-May-2018 FINDINGS: CT CHEST FINDINGS Cardiovascular: The heart is normal in size. The thoracic aorta is grossly unremarkable, though difficult to fully assess without contrast. The great vessels are unremarkable in appearance. Mediastinum/Nodes: The mediastinum is unremarkable in appearance. No definite mediastinal lymphadenopathy is seen. No pericardial effusion is identified. The thyroid gland is grossly unremarkable. No axillary lymphadenopathy is appreciated. Lungs/Pleura: Scattered nodular airspace opacities are noted bilaterally, with larger airspace opacities on the right, measuring up to 5.6 cm in size. This is suspicious for septic emboli. Underlying diffuse interstitial prominence is noted, with trace bilateral pleural fluid, concerning for mild pulmonary edema. No pneumothorax is seen. Musculoskeletal: No acute osseous abnormalities are identified. The visualized musculature is unremarkable in appearance. CT ABDOMEN PELVIS  FINDINGS Hepatobiliary: The liver is unremarkable in appearance. The gallbladder is unremarkable in appearance. The common bile duct remains normal in caliber. Pancreas: The pancreas is within normal limits. Spleen: The spleen is unremarkable in appearance. Adrenals/Urinary Tract: The adrenal glands are unremarkable in appearance. There is question of haziness about the left renal pelvis, and mild left-sided perinephric stranding is seen. There is no evidence of hydronephrosis. No renal or ureteral stones are identified. Stomach/Bowel: The stomach is unremarkable in appearance. The small bowel is within normal limits. The appendix is normal in caliber,  without evidence of appendicitis. There is question of mild wall thickening at the cecum and proximal ascending colon, which could reflect a mild infectious or inflammatory process. Mild diffuse mesenteric edema is noted. Vascular/Lymphatic: The abdominal aorta is grossly unremarkable, though not well assessed without contrast. No definite focal venous abnormalities are characterized, though the venous structures are also not well assessed. No definite retroperitoneal or pelvic sidewall lymphadenopathy is seen. Reproductive: The bladder is mildly distended, and contains air, secondary to Foley catheter placement. The uterus is grossly unremarkable in appearance. No suspicious adnexal masses are seen. Other: A small amount of ascites is seen within the pelvis. Musculoskeletal: No acute osseous abnormalities are identified. The visualized musculature is unremarkable in appearance. IMPRESSION: 1. Scattered nodular airspace opacities bilaterally, with larger airspace opacities on the right, measuring up to 5.6 cm in size. This is suspicious for diffuse septic emboli. 2. Underlying diffuse interstitial prominence, with trace bilateral pleural fluid, concerning for mild pulmonary edema. 3. Question of mild wall thickening at the cecum and proximal ascending colon, which could reflect a mild infectious or inflammatory process. 4. Mild diffuse mesenteric edema noted. 5. Question of haziness about the left renal pelvis, and mild left-sided perinephric stranding. Would correlate for any evidence of left-sided pyelonephritis. 6. Small amount of ascites within the pelvis. Electronically Signed   By: Roanna Raider M.D.   On: 04/15/2018 01:01   Dg Chest 1 View  Result Date: 03/26/2018 CLINICAL DATA:  Sepsis. Heroin withdrawal. EXAM: CHEST  1 VIEW COMPARISON:  11/26/2017 FINDINGS: Normal heart size and mediastinal contours. Confluent opacities are noted in both upper lobes and to a greater extent in the right lower lobe  without cavitation. Multilobar pneumonia might account for this or potentially a septic emboli without cavitation. No effusion or edema. No bone destruction or fracture. IMPRESSION: Confluent opacities in both upper lobes and to a greater extent in the right lower lobe without cavitation. Multilobar pneumonia might account for this or potentially non cavitary septic emboli given patient history. Electronically Signed   By: Tollie Eth M.D.   On: 04/04/2018 15:30   Ct Head Wo Contrast  Result Date: 04/05/2018 CLINICAL DATA:  Patient detoxing from heroin. Altered mental status EXAM: CT HEAD WITHOUT CONTRAST TECHNIQUE: Contiguous axial images were obtained from the base of the skull through the vertex without intravenous contrast. COMPARISON:  None. FINDINGS: Brain: Ventricles and sulci are appropriate for patient's age. No evidence for acute cortically based infarct, intracranial hemorrhage, mass lesion or mass-effect. Vascular: Unremarkable Skull: Intact Sinuses/Orbits: Paranasal sinuses are well aerated. Mastoid air cells unremarkable. Orbits are unremarkable. Other: None. IMPRESSION: No acute intracranial process. Electronically Signed   By: Annia Belt M.D.   On: 03/19/2018 15:21   Ct Chest Wo Contrast  Result Date: 04/15/2018 CLINICAL DATA:  Acute onset of sepsis. EXAM: CT CHEST, ABDOMEN AND PELVIS WITHOUT CONTRAST TECHNIQUE: Multidetector CT imaging of the chest, abdomen and pelvis was performed  following the standard protocol without IV contrast. COMPARISON:  CT of the abdomen and pelvis performed 11/26/2017, and chest radiograph performed 04/04/2018 FINDINGS: CT CHEST FINDINGS Cardiovascular: The heart is normal in size. The thoracic aorta is grossly unremarkable, though difficult to fully assess without contrast. The great vessels are unremarkable in appearance. Mediastinum/Nodes: The mediastinum is unremarkable in appearance. No definite mediastinal lymphadenopathy is seen. No pericardial effusion  is identified. The thyroid gland is grossly unremarkable. No axillary lymphadenopathy is appreciated. Lungs/Pleura: Scattered nodular airspace opacities are noted bilaterally, with larger airspace opacities on the right, measuring up to 5.6 cm in size. This is suspicious for septic emboli. Underlying diffuse interstitial prominence is noted, with trace bilateral pleural fluid, concerning for mild pulmonary edema. No pneumothorax is seen. Musculoskeletal: No acute osseous abnormalities are identified. The visualized musculature is unremarkable in appearance. CT ABDOMEN PELVIS FINDINGS Hepatobiliary: The liver is unremarkable in appearance. The gallbladder is unremarkable in appearance. The common bile duct remains normal in caliber. Pancreas: The pancreas is within normal limits. Spleen: The spleen is unremarkable in appearance. Adrenals/Urinary Tract: The adrenal glands are unremarkable in appearance. There is question of haziness about the left renal pelvis, and mild left-sided perinephric stranding is seen. There is no evidence of hydronephrosis. No renal or ureteral stones are identified. Stomach/Bowel: The stomach is unremarkable in appearance. The small bowel is within normal limits. The appendix is normal in caliber, without evidence of appendicitis. There is question of mild wall thickening at the cecum and proximal ascending colon, which could reflect a mild infectious or inflammatory process. Mild diffuse mesenteric edema is noted. Vascular/Lymphatic: The abdominal aorta is grossly unremarkable, though not well assessed without contrast. No definite focal venous abnormalities are characterized, though the venous structures are also not well assessed. No definite retroperitoneal or pelvic sidewall lymphadenopathy is seen. Reproductive: The bladder is mildly distended, and contains air, secondary to Foley catheter placement. The uterus is grossly unremarkable in appearance. No suspicious adnexal masses are  seen. Other: A small amount of ascites is seen within the pelvis. Musculoskeletal: No acute osseous abnormalities are identified. The visualized musculature is unremarkable in appearance. IMPRESSION: 1. Scattered nodular airspace opacities bilaterally, with larger airspace opacities on the right, measuring up to 5.6 cm in size. This is suspicious for diffuse septic emboli. 2. Underlying diffuse interstitial prominence, with trace bilateral pleural fluid, concerning for mild pulmonary edema. 3. Question of mild wall thickening at the cecum and proximal ascending colon, which could reflect a mild infectious or inflammatory process. 4. Mild diffuse mesenteric edema noted. 5. Question of haziness about the left renal pelvis, and mild left-sided perinephric stranding. Would correlate for any evidence of left-sided pyelonephritis. 6. Small amount of ascites within the pelvis. Electronically Signed   By: Roanna Raider M.D.   On: 04/15/2018 01:01   Dg Chest Port 1 View  Result Date: 04/15/2018 CLINICAL DATA:  Droplet precautions. Hx ETT Pt was unresponsive Negative pregnancy test EXAM: PORTABLE CHEST 1 VIEW COMPARISON:  Chest x-ray 04/05/2018, CT of the chest on 04/15/2018 FINDINGS: Endotracheal tube is in place with tip approximately 2.9 centimeters above the carina. Nasogastric tube is in place, tip overlying the level of the stomach. Persistent significant lung opacities, including masslike densities, appear stable. IMPRESSION: 1. Stable appearance of the chest. 2. Endotracheal tube is in place with tip approximately 2.9 centimeters above the carina. Electronically Signed   By: Norva Pavlov M.D.   On: 04/15/2018 10:51   Dg Chest [facility]  Result Date: 01-28-2018 CLINICAL DATA:  Sepsis.  History of heroin use. EXAM: PORTABLE CHEST 1 VIEW COMPARISON:  01-28-2018 at 1513 hours FINDINGS: The cardiomediastinal silhouette is unchanged with normal heart size. There are persistent confluent nodular/partly  masslike opacities in the right lower lung which are similar to the prior study. Diffuse reticulonodular densities are again seen throughout both lungs, and there are Kerley lines noted peripherally in both lungs which are new from the prior study. No pleural effusion or pneumothorax is identified. IMPRESSION: Persistent confluent, nodular opacity in the right lower lung with reticulonodular densities diffusely throughout both lungs. Findings may reflect pneumonia and possibly septic emboli. Superimposed interstitial edema is also possible. Radiographic follow-up recommended to exclude underlying neoplasm. Electronically Signed   By: Sebastian AcheAllen  Grady M.D.   On: 01-28-2018 21:59   [place] Ekg Site Rite  Result Date: 04/15/2018 If Site Rite image not attached, placement could not be confirmed due to current cardiac rhythm.   ASSESSMENT: This is a very pleasant 32 years old white female with history of drug abuse and presented with sepsis and thrombocytopenia likely secondary to DIC.  I personally reviewed the peripheral blood smear and I did not see any concerning schistocytes on the smear but there was increased granulocytes and bands.  Consistent with sepsis.  The patient had certain features that could be consistent with TTP including the fever, renal insufficiency as well as the mental status change in addition to the thrombocytopenia and now mild anemia but no clear schistocytes on the blood smear.   PLAN: I order several studies today including LDH as well as [id number] to rule the small possibility that this could be TTP. Her DIC panel is positive for low fibrinogen as well as mild elevation of PT and PTT consistent with the possibility that her thrombocytopenia is likely secondary to sepsis. I would consider aggressive treatment of the underlying infection.  The patient is followed by critical care team as well as infectious disease for management of this condition. If no improvement in her condition with  the aggressive treatment of the infection, we may consider the patient for plasma exchange for few days to see if she would response to this option. I will continue to monitor her labs closely and give further recommendation as needed. The patient voices understanding of current disease status and treatment options and is in agreement with the current care plan.  All questions were answered. The patient knows to call the clinic with any problems, questions or concerns. We can certainly see the patient much sooner if necessary.  Thank you so much for allowing me to participate in the care of Frances Le. I will continue to follow up the patient with you and assist in her care.  Disclaimer: This note was dictated with voice recognition software. Similar sounding words can inadvertently be transcribed and may not be corrected upon review.   Frances Le April 15, 2018, 1:02 PM

## 2018-04-15 NOTE — Progress Notes (Signed)
Pt tenuous to manage over coarse of day. Difficult to achieve appropriate sedation and maintain adequate blood pressure. Persistent, frequent contact with MD or Elink with updates to assessments, interventions and responses.

## 2018-04-15 NOTE — Progress Notes (Signed)
CRITICAL VALUE ALERT  Critical Value: plt=21  Date & Time Notied:  04/15/18 1029  Provider Notified: Molli KnockYacoub  Orders Received/Actions taken: Awaiting new orders

## 2018-04-15 NOTE — Consult Note (Signed)
[facility]  Total days of antibiotics 2        Day 2 vanco/ctx Reason for Consult: mssa bacteremia    Referring Physician: yacoub  Active Problems:   T.T.P. syndrome (HCC)   Sepsis (HCC)    HPI: Frances Le is a 32 y.o. 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 On admit, found to be solomnent at times, withdraws to pain- suspicious for heroin withdrawal. She was found febrile to 100.3, tachycardic. Labs significant for WBC of 36.4K with left shift, plt 8, Cr 1.91, LA 3.9, UA showing pyuria. CXR showing patchy infiltrates bilaterally - with CT showing pattern more suggestive of septic emboli. Blood cx found to have MSSA bacteremia. She was admitted to icu where arterial lines, central lines and intubation was required due to respiratory distress, needing pressors. Physical exam has numerous excoriation marks/insect bites? On extremities. Since being in admitted, steady increase in pressors. Physical exam having edema from fluid rescusitation, dusky toes bilaterally L> R. Petechiae to right foot. Being seen by hematology for evaluation of thrombocytopenia  Past Medical History:  Diagnosis Date  . Anxiety   . Bipolar 1 disorder (HCC)   . Depression   . Drug abuse (HCC)   . Pregnant     Allergies: No Known Allergies   MEDICATIONS: . sodium chloride   Intravenous Once  . aspirin  150 mg Rectal Daily  . chlorhexidine  15 mL Mouth Rinse BID  . feeding supplement (PRO-STAT SUGAR FREE 64)  30 mL Per Tube BID  . feeding supplement (VITAL HIGH PROTEIN)  1,000 mL Per Tube Q24H  . fentaNYL      . fentaNYL (SUBLIMAZE) injection  50 mcg Intravenous Once  . mouth rinse  15 mL Mouth Rinse q12n4p  . midazolam      . pantoprazole (PROTONIX) IV  40 mg Intravenous QHS    Social History   Tobacco Use  .  Smoking status: Current Every Day Smoker    Packs/day: 1.00    Types: Cigarettes  . Smokeless tobacco: Never Used  Substance Use Topics  . Alcohol use: Yes  . Drug use: Yes    Types: IV, Methamphetamines    No family history on file.  Review of Systems - unable to obtain since the patient intubated/sedated   OBJECTIVE: Temp:  [97.7 F (36.5 C)-99.9 F (37.7 C)] 98.8 F (37.1 C) (12/29 0800) Pulse Rate:  [114-133] 118 (12/29 1018) Resp:  [12-44] 18 (12/29 1018) BP: (86-117)/(41-75) 108/50 (12/29 0715) SpO2:  [93 %-100 %] 93 % (12/29 1018) FiO2 (%):  [40 %] 40 % (12/29 1018) Weight:  [49.9 kg-52 kg] 52 kg (12/29 0500) Physical Exam  Constitutional:  Intubated/sedated. appears well-developed and well-nourished. No distress.  HENT: Blanco/AT, PERRLA, no scleral icterus. Mouth/Throat: OETT in place. Blood tinged lips Cardiovascular: Normal rate, regular rhythm and normal heart sounds. Exam reveals no gallop and no friction rub.  Pulmonary/Chest: Effort normal and breath sounds normal. No respiratory distress.  Abdominal: Soft. Bowel sounds are decreasedexhibits no distension. There is no tenderness.  Lymphadenopathy: no cervical adenopathy. No axillary adenopathy Skin: dusky toes bilaterally L> R. petechaie to dorsum of left foot. Scattered eschar/healing bites? On legs bilaterally Psychiatric: sedated    LABS: Results for orders placed or performed during the hospital encounter of 04/11/2018 (from the past  48 hour(s))  Comprehensive metabolic panel     Status: Abnormal   Collection Time: 03/26/2018  2:25 PM  Result Value Ref Range   Sodium 133 (L) 135 - 145 mmol/L   Potassium 3.2 (L) 3.5 - 5.1 mmol/L   Chloride 98 98 - 111 mmol/L   CO2 19 (L) 22 - 32 mmol/L   Glucose, Bld 126 (H) 70 - 99 mg/dL   BUN 409 (H) 6 - 20 mg/dL    Comment: RESULTS CONFIRMED BY MANUAL DILUTION   Creatinine, Ser 2.42 (H) 0.44 - 1.00 mg/dL   Calcium 7.7 (L) 8.9 - 10.3 mg/dL   Total Protein 5.8 (L) 6.5 -  8.1 g/dL   Albumin 2.1 (L) 3.5 - 5.0 g/dL   AST 53 (H) 15 - 41 U/L   ALT 35 0 - 44 U/L   Alkaline Phosphatase 139 (H) 38 - 126 U/L   Total Bilirubin 6.8 (H) 0.3 - 1.2 mg/dL   GFR calc non Af Amer 26 (L) >60 mL/min   GFR calc Af Amer 30 (L) >60 mL/min   Anion gap 16 (H) 5 - 15    Comment: Performed at [facility], [address], [place], [place] [address]  CBC with Differential     Status: Abnormal   Collection Time: 03/22/2018  2:25 PM  Result Value Ref Range   WBC 36.4 (H) 4.0 - 10.5 K/uL   RBC 4.64 3.87 - 5.11 MIL/uL   Hemoglobin 13.0 12.0 - 15.0 g/dL   HCT 47.8 29.5 - 62.1 %   MCV 85.8 80.0 - 100.0 fL   MCH 28.0 26.0 - 34.0 pg   MCHC 32.7 30.0 - 36.0 g/dL   RDW 30.8 65.7 - 84.6 %   Platelets 8 (LL) 150 - 400 K/uL    Comment: PLATELET COUNT CONFIRMED BY SMEAR SPECIMEN CHECKED FOR CLOTS Immature Platelet Fraction may be clinically indicated, consider ordering this additional test [id number] THIS CRITICAL RESULT HAS VERIFIED AND BEEN CALLED TO HYATT,K BY BOBBIE MATTHEWS ON 12 28 2019 AT 1524, AND HAS BEEN READ BACK.     nRBC 0.0 0.0 - 0.2 %   Neutrophils Relative % 89 %   Neutro Abs 32.3 (H) 1.7 - 7.7 K/uL   Lymphocytes Relative 3 %   Lymphs Abs 1.2 0.7 - 4.0 K/uL   Monocytes Relative 3 %   Monocytes Absolute 1.0 0.1 - 1.0 K/uL   Eosinophils Relative 0 %   Eosinophils Absolute 0.0 0.0 - 0.5 K/uL   Basophils Relative 0 %   Basophils Absolute 0.0 0.0 - 0.1 K/uL   WBC Morphology MILD LEFT SHIFT (1-5% METAS, OCC MYELO, OCC BANDS)     Comment: VACUOLATED NEUTROPHILS   Immature Granulocytes 5 %   Abs Immature Granulocytes 1.88 (H) 0.00 - 0.07 K/uL    Comment: Performed at [facility], [address], [place], [place] [address]  Protime-INR     Status: Abnormal   Collection Time: 04/17/2018  2:25 PM  Result Value Ref Range   Prothrombin Time 16.1 (H) 11.4 - 15.2 seconds   INR 1.30     Comment: Performed at [facility], [address] [place], [place] [address]  Culture, blood  (Routine x 2)     Status: None (Preliminary result)   Collection Time: 04/07/2018  2:25 PM  Result Value Ref Range   Specimen Description      BLOOD LEFT FOREARM Performed at [facility], [address], [place], [place] [address]  Special Requests      BOTTLES DRAWN AEROBIC AND ANAEROBIC Blood Culture adequate volume Performed at [facility], [address], [place], [place] [address]    Culture  Setup Time      GRAM POSITIVE COCCI Gram Stain Report Called to,Read Back By and Verified With: BRYK V. ANAEROBIC @ 0735 ON [id number] BY HEDNERSON L. CRITICAL RESULT CALLED TO, READ BACK BY AND VERIFIED WITH: M. MACCIA, PHARMD AT 1055 ON 04/15/18 BY C. JESSUP, MLT. Performed at [facility], [address] [place], [place] [address]    Culture GRAM POSITIVE COCCI    Report Status PENDING   Culture, blood (Routine x 2)     Status: None (Preliminary result)   Collection Time: 03/28/2018  2:25 PM  Result Value Ref Range   Specimen Description      BLOOD LEFT FOREARM Performed at [facility], [address], [place], [place] [address]    Special Requests      BOTTLES DRAWN AEROBIC AND ANAEROBIC Blood Culture adequate volume Performed at [facility], [address] [place], [place] [address]    Culture  Setup Time      GRAM POSITIVE COCCI Gram Stain Report Called to,Read Back By and Verified With: BRYK V. ANAEROBIC @ 0735 ON [id number] HENDERSON L. CRITICAL RESULT CALLED TO, READ BACK BY AND VERIFIED WITH: M. Roselind Messier, PHARMD AT 1055 ON 04/15/18 BY C. JESSUP, MLT. Performed at [facility], [address] [place], [place] [address]    Culture GRAM POSITIVE COCCI    Report Status PENDING   Ammonia     Status: None   Collection Time: 03/20/2018  2:25 PM  Result Value Ref Range   Ammonia 34 9 - 35 umol/L    Comment: Performed at [facility], [address], [place], [place] [address]  Procalcitonin     Status: None   Collection Time: 04/04/2018  2:25 PM  Result Value Ref Range    Procalcitonin 16.86 ng/mL    Comment:        Interpretation: PCT >= 10 ng/mL: Important systemic inflammatory response, almost exclusively due to severe bacterial sepsis or septic shock. (NOTE)       Sepsis PCT Algorithm           Lower Respiratory Tract                                      Infection PCT Algorithm    ----------------------------     ----------------------------         PCT < 0.25 ng/mL                PCT < 0.10 ng/mL         Strongly encourage             Strongly discourage   discontinuation of antibiotics    initiation of antibiotics    ----------------------------     -----------------------------       PCT 0.25 - 0.50 ng/mL            PCT 0.10 - 0.25 ng/mL               OR       >80% decrease in PCT            Discourage initiation of  antibiotics      Encourage discontinuation           of antibiotics    ----------------------------     -----------------------------         PCT >= 0.50 ng/mL              PCT 0.26 - 0.50 ng/mL                AND       <80% decrease in PCT             Encourage initiation of                                             antibiotics       Encourage continuation           of antibiotics    ----------------------------     -----------------------------        PCT >= 0.50 ng/mL                  PCT > 0.50 ng/mL               AND         increase in PCT                  Strongly encourage                                      initiation of antibiotics    Strongly encourage escalation           of antibiotics                                     -----------------------------                                           PCT <= 0.25 ng/mL                                                 OR                                        > 80% decrease in PCT                                     Discontinue / Do not initiate                                             antibiotics Performed at [facility], [address] [place], [place] [address]   I-Stat CG4 Lactic Acid, ED     Status: Abnormal   Collection Time:  2018-05-11  2:30 PM  Result Value Ref Range   Lactic Acid, Venous 5.12 (HH) 0.5 - 1.9 mmol/L   Comment NOTIFIED PHYSICIAN   I-Stat beta hCG blood, ED     Status: None   Collection Time: 05-11-18  2:31 PM  Result Value Ref Range   I-stat hCG, quantitative <5.0 <5 mIU/mL   Comment 3            Comment:   GEST. AGE      CONC.  (mIU/mL)   <=1 WEEK        5 - 50     2 WEEKS       50 - 500     3 WEEKS       100 - 10,000     4 WEEKS     1,000 - 30,000        FEMALE AND NON-PREGNANT FEMALE:     LESS THAN 5 mIU/mL   I-Stat CG4 Lactic Acid, ED     Status: Abnormal   Collection Time: 11-May-2018  4:31 PM  Result Value Ref Range   Lactic Acid, Venous 4.10 (HH) 0.5 - 1.9 mmol/L   Comment NOTIFIED PHYSICIAN   Urinalysis, Routine w reflex microscopic     Status: Abnormal   Collection Time: 2018-05-11  7:41 PM  Result Value Ref Range   Color, Urine AMBER (A) YELLOW    Comment: BIOCHEMICALS MAY BE AFFECTED BY COLOR   APPearance HAZY (A) CLEAR   Specific Gravity, Urine 1.014 1.005 - 1.030   pH 5.0 5.0 - 8.0   Glucose, UA NEGATIVE NEGATIVE mg/dL   Hgb urine dipstick MODERATE (A) NEGATIVE   Bilirubin Urine NEGATIVE NEGATIVE   Ketones, ur NEGATIVE NEGATIVE mg/dL   Protein, ur NEGATIVE NEGATIVE mg/dL   Nitrite POSITIVE (A) NEGATIVE   Leukocytes, UA TRACE (A) NEGATIVE   RBC / HPF 0-5 0 - 5 RBC/hpf   WBC, UA 21-50 0 - 5 WBC/hpf   Bacteria, UA FEW (A) NONE SEEN   Squamous Epithelial / LPF 0-5 0 - 5    Comment: Performed at [facility], [address], [place], [place] [address]  Strep pneumoniae urinary antigen  (not at [facility])     Status: None   Collection Time: 2018/05/11  8:54 PM  Result Value Ref Range   Strep Pneumo Urinary Antigen NEGATIVE NEGATIVE    Comment:        Infection due to S. pneumoniae cannot be absolutely ruled out since the antigen present may be below the detection  limit of the test.   Pregnancy, urine     Status: None   Collection Time: 05-11-18  8:54 PM  Result Value Ref Range   Preg Test, Ur NEGATIVE NEGATIVE    Comment:        THE SENSITIVITY OF THIS METHODOLOGY IS >20 mIU/mL. Performed at [facility], [address], [place], [place] [address]   MRSA PCR Screening     Status: None   Collection Time: May 11, 2018  8:57 PM  Result Value Ref Range   MRSA by PCR NEGATIVE NEGATIVE    Comment:        The GeneXpert MRSA Assay (FDA approved for NASAL specimens only), is one component of a comprehensive MRSA colonization surveillance program. It is not intended to diagnose MRSA infection nor to guide or monitor treatment for MRSA infections. Performed at [facility], [address], [place], [place] [address]   ABO/Rh  Status: None   Collection Time: 2017-07-13  9:06 PM  Result Value Ref Range   ABO/RH(D)      O NEG Performed at [facility], [address] [place], [place] [address]   Prepare Pheresed Platelets     Status: None (Preliminary result)   Collection Time: 2017-07-13  9:07 PM  Result Value Ref Range   Unit Number [id number]    Blood Component Type PLTPHER LR1    Unit division 00    Status of Unit ISSUED    Transfusion Status OK TO TRANSFUSE    Unit Number [id number]    Blood Component Type PLTP1 PSORALEN TREATED    Unit division 00    Status of Unit ISSUED    Transfusion Status      OK TO TRANSFUSE Performed at [facility], [address] [place], [place] [address]   Glucose, capillary     Status: Abnormal   Collection Time: 2017-07-13  9:11 PM  Result Value Ref Range   Glucose-Capillary 103 (H) 70 - 99 mg/dL   Comment 1 Notify RN   I-STAT 3, arterial blood gas (G3+)     Status: Abnormal   Collection Time: 2017-07-13  9:19 PM  Result Value Ref Range   pH, Arterial 7.413 7.350 - 7.450   pCO2 arterial 25.2 (L) 32.0 - 48.0 mmHg   pO2, Arterial 94.0 83.0 - 108.0 mmHg   Bicarbonate 15.9  (L) 20.0 - 28.0 mmol/L   TCO2 17 (L) 22 - 32 mmol/L   O2 Saturation 97.0 %   Acid-base deficit 7.0 (H) 0.0 - 2.0 mmol/L   Patient temperature 38.1 C    Collection site RADIAL, ALLEN'S TEST ACCEPTABLE    Drawn by RT    Sample type ARTERIAL   Culture, blood (routine x 2)     Status: None (Preliminary result)   Collection Time: 2017-07-13 11:30 PM  Result Value Ref Range   Specimen Description      BLOOD RIGHT ARM Performed at [facility], [address] [place], [place] [address]    Special Requests      BOTTLES DRAWN AEROBIC ONLY Blood Culture results may not be optimal due to an inadequate volume of blood received in culture bottles   Culture PENDING    Report Status PENDING   Comprehensive metabolic panel     Status: Abnormal   Collection Time: 2017-07-13 11:33 PM  Result Value Ref Range   Sodium 139 135 - 145 mmol/L   Potassium 3.9 3.5 - 5.1 mmol/L   Chloride 109 98 - 111 mmol/L   CO2 16 (L) 22 - 32 mmol/L   Glucose, Bld 85 70 - 99 mg/dL   BUN 97 (H) 6 - 20 mg/dL   Creatinine, Ser 0.101.91 (H) 0.44 - 1.00 mg/dL   Calcium 6.9 (L) 8.9 - 10.3 mg/dL   Total Protein 4.6 (L) 6.5 - 8.1 g/dL   Albumin 1.5 (L) 3.5 - 5.0 g/dL   AST 52 (H) 15 - 41 U/L   ALT 29 0 - 44 U/L   Alkaline Phosphatase 129 (H) 38 - 126 U/L   Total Bilirubin 5.7 (H) 0.3 - 1.2 mg/dL   GFR calc non Af Amer 35 (L) >60 mL/min   GFR calc Af Amer 40 (L) >60 mL/min   Anion gap 14 5 - 15    Comment: Performed at [facility], [address] [place], [place] [address]  Magnesium  Status: Abnormal   Collection Time: 2018-04-26 11:33 PM  Result Value Ref Range   Magnesium 2.5 (H) 1.7 - 2.4 mg/dL    Comment: Performed at [facility], [address] [place], [place] [address]  Phosphorus     Status: None   Collection Time: 04/26/18 11:33 PM  Result Value Ref Range   Phosphorus 4.1 2.5 - 4.6 mg/dL    Comment: Performed at [facility], [address] [place], [place] [address]  Amylase      Status: Abnormal   Collection Time: Apr 26, 2018 11:33 PM  Result Value Ref Range   Amylase 8 (L) 28 - 100 U/L    Comment: Performed at [facility], [address], [place], [place] [address]  Lipase, blood     Status: None   Collection Time: April 26, 2018 11:33 PM  Result Value Ref Range   Lipase 17 11 - 51 U/L    Comment: Performed at [facility], [address] [place], [place] [address]  Troponin I - Now Then Q6H     Status: Abnormal   Collection Time: 2018/04/26 11:33 PM  Result Value Ref Range   Troponin I 0.15 (HH) <0.03 ng/mL    Comment: CRITICAL RESULT CALLED TO, READ BACK BY AND VERIFIED WITH: YOUSEF,M RN 04/15/2018 0037 JORDANS Performed at [facility], [address], [place], [place] [address]   Lactic acid, plasma     Status: Abnormal   Collection Time: 04-26-2018 11:33 PM  Result Value Ref Range   Lactic Acid, Venous 3.9 (HH) 0.5 - 1.9 mmol/L    Comment: CRITICAL RESULT CALLED TO, READ BACK BY AND VERIFIED WITH: Elwin Mocha RN 04/15/2018 0035 JORDANS Performed at [facility], [address], [place], [place] [address]   Procalcitonin     Status: None   Collection Time: Apr 26, 2018 11:33 PM  Result Value Ref Range   Procalcitonin 9.42 ng/mL    Comment:        Interpretation: PCT > 2 ng/mL: Systemic infection (sepsis) is likely, unless other causes are known. (NOTE)       Sepsis PCT Algorithm           Lower Respiratory Tract                                      Infection PCT Algorithm    ----------------------------     ----------------------------         PCT < 0.25 ng/mL                PCT < 0.10 ng/mL         Strongly encourage             Strongly discourage   discontinuation of antibiotics    initiation of antibiotics    ----------------------------     -----------------------------       PCT 0.25 - 0.50 ng/mL            PCT 0.10 - 0.25 ng/mL               OR       >80% decrease in PCT            Discourage initiation of  antibiotics      Encourage discontinuation           of antibiotics    ----------------------------     -----------------------------         PCT >= 0.50 ng/mL              PCT 0.26 - 0.50 ng/mL               AND       <80% decrease in PCT              Encourage initiation of                                             antibiotics       Encourage continuation           of antibiotics    ----------------------------     -----------------------------        PCT >= 0.50 ng/mL                  PCT > 0.50 ng/mL               AND         increase in PCT                  Strongly encourage                                      initiation of antibiotics    Strongly encourage escalation           of antibiotics                                     -----------------------------                                           PCT <= 0.25 ng/mL                                                 OR                                        > 80% decrease in PCT                                     Discontinue / Do not initiate                                             antibiotics Performed at [facility], [address], [place], [place] [address]   Brain natriuretic peptide     Status: Abnormal   Collection Time: 04/12/2018  11:33 PM  Result Value Ref Range   B Natriuretic Peptide 201.6 (H) 0.0 - 100.0 pg/mL    Comment: Performed at [facility], [address], [place], [place] [address]  Cortisol     Status: None   Collection Time: 29-Apr-2018 11:33 PM  Result Value Ref Range   Cortisol, Plasma 48.4 ug/dL    Comment: (NOTE) AM    6.7 - 22.6 ug/dL PM   <60.4       ug/dL Performed at [facility], [address] [place], [place] [address]   CBC WITH DIFFERENTIAL     Status: Abnormal   Collection Time: April 29, 2018 11:33 PM  Result Value Ref Range   WBC 35.0 (H) 4.0 - 10.5 K/uL   RBC 3.64 (L) 3.87 - 5.11 MIL/uL   Hemoglobin 10.4 (L) 12.0 - 15.0 g/dL   HCT 11.9  (L) 14.7 - 46.0 %   MCV 85.7 80.0 - 100.0 fL   MCH 28.6 26.0 - 34.0 pg   MCHC 33.3 30.0 - 36.0 g/dL   RDW 82.9 56.2 - 13.0 %   Platelets 7 (LL) 150 - 400 K/uL    Comment: REPEATED TO VERIFY PLATELET COUNT CONFIRMED BY SMEAR SPECIMEN CHECKED FOR CLOTS Immature Platelet Fraction may be clinically indicated, consider ordering this additional test [id number] CRITICAL RESULT CALLED TO, READ BACK BY AND VERIFIED WITH: Kathalene Frames RN 0030 [id number] SHORTT    nRBC 0.1 0.0 - 0.2 %   Neutrophils Relative % 86 %   Neutro Abs 30.1 (H) 1.7 - 7.7 K/uL   Lymphocytes Relative 4 %   Lymphs Abs 1.3 0.7 - 4.0 K/uL   Monocytes Relative 5 %   Monocytes Absolute 1.6 (H) 0.1 - 1.0 K/uL   Eosinophils Relative 0 %   Eosinophils Absolute 0.0 0.0 - 0.5 K/uL   Basophils Relative 0 %   Basophils Absolute 0.1 0.0 - 0.1 K/uL   WBC Morphology INCREASED BANDS (>20% BANDS)     Comment: SMUDGE CELLS TOXIC GRANULATION VACUOLATED NEUTROPHILS    Immature Granulocytes 5 %   Abs Immature Granulocytes 1.88 (H) 0.00 - 0.07 K/uL   Burr Cells PRESENT    Polychromasia PRESENT     Comment: Performed at [facility], [address], [place], [place] [address]  Protime-INR     Status: Abnormal   Collection Time: 04-29-2018 11:33 PM  Result Value Ref Range   Prothrombin Time 17.5 (H) 11.4 - 15.2 seconds    Comment: REPEATED TO VERIFY   INR 1.45     Comment: Performed at [facility], [address], [place], [place] [address]  APTT     Status: None   Collection Time: 04-29-2018 11:33 PM  Result Value Ref Range   aPTT 32 24 - 36 seconds    Comment: Performed at [facility], [address], [place], [place] [address]  Type and screen If need to transfuse blood products please use the blood administration order set     Status: None   Collection Time: 2018/04/29 11:33 PM  Result Value Ref Range   ABO/RH(D) O NEG    Antibody Screen NEG    Sample Expiration      04/17/2018 Performed at [facility], [address], [place], [place] [address]   Culture, blood (routine x 2)     Status: None (Preliminary result)   Collection Time: 04-29-18 11:33 PM  Result Value Ref Range  Specimen Description      BLOOD RIGHT HAND Performed at [facility], [address], [place], [place] [address]    Special Requests      BOTTLES DRAWN AEROBIC ONLY Blood Culture adequate volume   Culture PENDING    Report Status PENDING   Fibrinogen     Status: Abnormal   Collection Time: May 13, 2018 11:33 PM  Result Value Ref Range   Fibrinogen 206 (L) 210 - 475 mg/dL    Comment: REPEATED TO VERIFY Performed at [facility], [address] [place], [place] [address]   Troponin I - Now Then Q6H     Status: Abnormal   Collection Time: 04/15/18  2:50 AM  Result Value Ref Range   Troponin I 0.14 (HH) <0.03 ng/mL    Comment: CRITICAL VALUE NOTED.  VALUE IS CONSISTENT WITH PREVIOUSLY REPORTED AND CALLED VALUE. Performed at [facility], [address] [place], [place] [address]   Lactic acid, plasma     Status: Abnormal   Collection Time: 04/15/18  2:50 AM  Result Value Ref Range   Lactic Acid, Venous 3.8 (HH) 0.5 - 1.9 mmol/L    Comment: CRITICAL RESULT CALLED TO, READ BACK BY AND VERIFIED WITH: YOUSEF,M RN 04/15/2018 0353 JORDANS Performed at [facility], [address], [place], [place] [address]   Rh IG workup (includes ABO/Rh)     Status: None (Preliminary result)   Collection Time: 04/15/18  2:54 AM  Result Value Ref Range   Gestational Age(Wks) 0    ABO/RH(D)      O NEG Performed at [facility], [address] [place], [place] [address]    Unit Number [id number]    Blood Component Type RHIG    Unit division 00    Status of Unit ISSUED    Unit tag comment [redacted] weeks gestation    Transfusion Status OK TO TRANSFUSE   Troponin I - Now Then Q6H     Status: Abnormal   Collection Time: 04/15/18  8:20 AM  Result Value Ref Range   Troponin I 0.12 (HH) <0.03 ng/mL     Comment: CRITICAL VALUE NOTED.  VALUE IS CONSISTENT WITH PREVIOUSLY REPORTED AND CALLED VALUE. Performed at [facility], [address], [place], [place] [address]   CBC     Status: Abnormal   Collection Time: 04/15/18  8:20 AM  Result Value Ref Range   WBC 28.2 (H) 4.0 - 10.5 K/uL   RBC 3.62 (L) 3.87 - 5.11 MIL/uL   Hemoglobin 10.2 (L) 12.0 - 15.0 g/dL   HCT 13.2 (L) 44.0 - 10.2 %   MCV 86.7 80.0 - 100.0 fL   MCH 28.2 26.0 - 34.0 pg   MCHC 32.5 30.0 - 36.0 g/dL   RDW 72.5 36.6 - 44.0 %   Platelets 21 (LL) 150 - 400 K/uL    Comment: Immature Platelet Fraction may be clinically indicated, consider ordering this additional test [id number] THIS CRITICAL RESULT HAS VERIFIED AND BEEN CALLED TO K.BROWN,RN BY JESSICA WEBBER ON 12 29 2019 AT 1028, AND HAS BEEN READ BACK.  SPECIMEN CHECKED FOR CLOTS PLATELET COUNT CONFIRMED BY SMEAR REPEATED TO VERIFY CORRECTED ON 12/29 AT 1030: PREVIOUSLY REPORTED AS 21 Immature Platelet Fraction may be clinically indicated, consider ordering this additional test [id number] THIS CRITICAL RESULT HAS VERIFIED AND BEEN CALLED TO K.BROWN,RN BY JESSICA WEBBER ON 12 29 2019  AT 1028, AND HAS BEEN READ BACK.  nRBC 0.1 0.0 - 0.2 %    Comment: Performed at [facility], [address], [place], [place] [address]  Basic metabolic panel     Status: Abnormal   Collection Time: 04/15/18  8:20 AM  Result Value Ref Range   Sodium 144 135 - 145 mmol/L   Potassium 3.2 (L) 3.5 - 5.1 mmol/L   Chloride 114 (H) 98 - 111 mmol/L   CO2 17 (L) 22 - 32 mmol/L   Glucose, Bld 106 (H) 70 - 99 mg/dL   BUN 80 (H) 6 - 20 mg/dL   Creatinine, Ser 4.40 (H) 0.44 - 1.00 mg/dL   Calcium 7.3 (L) 8.9 - 10.3 mg/dL   GFR calc non Af Amer 42 (L) >60 mL/min   GFR calc Af Amer 49 (L) >60 mL/min   Anion gap 13 5 - 15    Comment: Performed at [facility], [address], [place], [place] [address]    MICRO:  IMAGING: Ct Abdomen Pelvis Wo Contrast  Result Date:  04/15/2018 CLINICAL DATA:  Acute onset of sepsis. EXAM: CT CHEST, ABDOMEN AND PELVIS WITHOUT CONTRAST TECHNIQUE: Multidetector CT imaging of the chest, abdomen and pelvis was performed following the standard protocol without IV contrast. COMPARISON:  CT of the abdomen and pelvis performed 11/26/2017, and chest radiograph performed 2018-04-15 FINDINGS: CT CHEST FINDINGS Cardiovascular: The heart is normal in size. The thoracic aorta is grossly unremarkable, though difficult to fully assess without contrast. The great vessels are unremarkable in appearance. Mediastinum/Nodes: The mediastinum is unremarkable in appearance. No definite mediastinal lymphadenopathy is seen. No pericardial effusion is identified. The thyroid gland is grossly unremarkable. No axillary lymphadenopathy is appreciated. Lungs/Pleura: Scattered nodular airspace opacities are noted bilaterally, with larger airspace opacities on the right, measuring up to 5.6 cm in size. This is suspicious for septic emboli. Underlying diffuse interstitial prominence is noted, with trace bilateral pleural fluid, concerning for mild pulmonary edema. No pneumothorax is seen. Musculoskeletal: No acute osseous abnormalities are identified. The visualized musculature is unremarkable in appearance. CT ABDOMEN PELVIS FINDINGS Hepatobiliary: The liver is unremarkable in appearance. The gallbladder is unremarkable in appearance. The common bile duct remains normal in caliber. Pancreas: The pancreas is within normal limits. Spleen: The spleen is unremarkable in appearance. Adrenals/Urinary Tract: The adrenal glands are unremarkable in appearance. There is question of haziness about the left renal pelvis, and mild left-sided perinephric stranding is seen. There is no evidence of hydronephrosis. No renal or ureteral stones are identified. Stomach/Bowel: The stomach is unremarkable in appearance. The small bowel is within normal limits. The appendix is normal in caliber,  without evidence of appendicitis. There is question of mild wall thickening at the cecum and proximal ascending colon, which could reflect a mild infectious or inflammatory process. Mild diffuse mesenteric edema is noted. Vascular/Lymphatic: The abdominal aorta is grossly unremarkable, though not well assessed without contrast. No definite focal venous abnormalities are characterized, though the venous structures are also not well assessed. No definite retroperitoneal or pelvic sidewall lymphadenopathy is seen. Reproductive: The bladder is mildly distended, and contains air, secondary to Foley catheter placement. The uterus is grossly unremarkable in appearance. No suspicious adnexal masses are seen. Other: A small amount of ascites is seen within the pelvis. Musculoskeletal: No acute osseous abnormalities are identified. The visualized musculature is unremarkable in appearance. IMPRESSION: 1. Scattered nodular airspace opacities bilaterally, with larger airspace opacities on the right, measuring up to 5.6 cm in size. This is suspicious for diffuse  septic emboli. 2. Underlying diffuse interstitial prominence, with trace bilateral pleural fluid, concerning for mild pulmonary edema. 3. Question of mild wall thickening at the cecum and proximal ascending colon, which could reflect a mild infectious or inflammatory process. 4. Mild diffuse mesenteric edema noted. 5. Question of haziness about the left renal pelvis, and mild left-sided perinephric stranding. Would correlate for any evidence of left-sided pyelonephritis. 6. Small amount of ascites within the pelvis. Electronically Signed   By: Roanna Raider M.D.   On: 04/15/2018 01:01   Dg Chest 1 View  Result Date: 04-22-2018 CLINICAL DATA:  Sepsis. Heroin withdrawal. EXAM: CHEST  1 VIEW COMPARISON:  11/26/2017 FINDINGS: Normal heart size and mediastinal contours. Confluent opacities are noted in both upper lobes and to a greater extent in the right lower lobe  without cavitation. Multilobar pneumonia might account for this or potentially a septic emboli without cavitation. No effusion or edema. No bone destruction or fracture. IMPRESSION: Confluent opacities in both upper lobes and to a greater extent in the right lower lobe without cavitation. Multilobar pneumonia might account for this or potentially non cavitary septic emboli given patient history. Electronically Signed   By: Tollie Eth M.D.   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 Altered mental status EXAM: CT HEAD WITHOUT CONTRAST TECHNIQUE: Contiguous axial images were obtained from the base of the skull through the vertex without intravenous contrast. COMPARISON:  None. FINDINGS: Brain: Ventricles and sulci are appropriate for patient's age. No evidence for acute cortically based infarct, intracranial hemorrhage, mass lesion or mass-effect. Vascular: Unremarkable Skull: Intact Sinuses/Orbits: Paranasal sinuses are well aerated. Mastoid air cells unremarkable. Orbits are unremarkable. Other: None. IMPRESSION: No acute intracranial process. Electronically Signed   By: Annia Belt M.D.   On: April 22, 2018 15:21   Ct Chest Wo Contrast  Result Date: 04/15/2018 CLINICAL DATA:  Acute onset of sepsis. EXAM: CT CHEST, ABDOMEN AND PELVIS WITHOUT CONTRAST TECHNIQUE: Multidetector CT imaging of the chest, abdomen and pelvis was performed following the standard protocol without IV contrast. COMPARISON:  CT of the abdomen and pelvis performed 11/26/2017, and chest radiograph performed 04/22/18 FINDINGS: CT CHEST FINDINGS Cardiovascular: The heart is normal in size. The thoracic aorta is grossly unremarkable, though difficult to fully assess without contrast. The great vessels are unremarkable in appearance. Mediastinum/Nodes: The mediastinum is unremarkable in appearance. No definite mediastinal lymphadenopathy is seen. No pericardial effusion  is identified. The thyroid gland is grossly unremarkable. No axillary lymphadenopathy is appreciated. Lungs/Pleura: Scattered nodular airspace opacities are noted bilaterally, with larger airspace opacities on the right, measuring up to 5.6 cm in size. This is suspicious for septic emboli. Underlying diffuse interstitial prominence is noted, with trace bilateral pleural fluid, concerning for mild pulmonary edema. No pneumothorax is seen. Musculoskeletal: No acute osseous abnormalities are identified. The visualized musculature is unremarkable in appearance. CT ABDOMEN PELVIS FINDINGS Hepatobiliary: The liver is unremarkable in appearance. The gallbladder is unremarkable in appearance. The common bile duct remains normal in caliber. Pancreas: The pancreas is within normal limits. Spleen: The spleen is unremarkable in appearance. Adrenals/Urinary Tract: The adrenal glands are unremarkable in appearance. There is question of haziness about the left renal pelvis, and mild left-sided perinephric stranding is seen. There is no evidence of hydronephrosis. No renal or ureteral stones are identified. Stomach/Bowel: The stomach is unremarkable in appearance. The small bowel is within normal limits. The appendix is normal in caliber, without evidence  of appendicitis. There is question of mild wall thickening at the cecum and proximal ascending colon, which could reflect a mild infectious or inflammatory process. Mild diffuse mesenteric edema is noted. Vascular/Lymphatic: The abdominal aorta is grossly unremarkable, though not well assessed without contrast. No definite focal venous abnormalities are characterized, though the venous structures are also not well assessed. No definite retroperitoneal or pelvic sidewall lymphadenopathy is seen. Reproductive: The bladder is mildly distended, and contains air, secondary to Foley catheter placement. The uterus is grossly unremarkable in appearance. No suspicious adnexal masses are  seen. Other: A small amount of ascites is seen within the pelvis. Musculoskeletal: No acute osseous abnormalities are identified. The visualized musculature is unremarkable in appearance. IMPRESSION: 1. Scattered nodular airspace opacities bilaterally, with larger airspace opacities on the right, measuring up to 5.6 cm in size. This is suspicious for diffuse septic emboli. 2. Underlying diffuse interstitial prominence, with trace bilateral pleural fluid, concerning for mild pulmonary edema. 3. Question of mild wall thickening at the cecum and proximal ascending colon, which could reflect a mild infectious or inflammatory process. 4. Mild diffuse mesenteric edema noted. 5. Question of haziness about the left renal pelvis, and mild left-sided perinephric stranding. Would correlate for any evidence of left-sided pyelonephritis. 6. Small amount of ascites within the pelvis. Electronically Signed   By: Roanna Raider M.D.   On: 04/15/2018 01:01   Dg Chest Port 1 View  Result Date: 04/15/2018 CLINICAL DATA:  Droplet precautions. Hx ETT Pt was unresponsive Negative pregnancy test EXAM: PORTABLE CHEST 1 VIEW COMPARISON:  Chest x-ray May 07, 2018, CT of the chest on 04/15/2018 FINDINGS: Endotracheal tube is in place with tip approximately 2.9 centimeters above the carina. Nasogastric tube is in place, tip overlying the level of the stomach. Persistent significant lung opacities, including masslike densities, appear stable. IMPRESSION: 1. Stable appearance of the chest. 2. Endotracheal tube is in place with tip approximately 2.9 centimeters above the carina. Electronically Signed   By: Norva Pavlov M.D.   On: 04/15/2018 10:51   Dg Chest Port 1 View  Result Date: 2018-05-07 CLINICAL DATA:  Sepsis.  History of heroin use. EXAM: PORTABLE CHEST 1 VIEW COMPARISON:  2018/05/07 at 1513 hours FINDINGS: The cardiomediastinal silhouette is unchanged with normal heart size. There are persistent confluent nodular/partly  masslike opacities in the right lower lung which are similar to the prior study. Diffuse reticulonodular densities are again seen throughout both lungs, and there are Kerley lines noted peripherally in both lungs which are new from the prior study. No pleural effusion or pneumothorax is identified. IMPRESSION: Persistent confluent, nodular opacity in the right lower lung with reticulonodular densities diffusely throughout both lungs. Findings may reflect pneumonia and possibly septic emboli. Superimposed interstitial edema is also possible. Radiographic follow-up recommended to exclude underlying neoplasm. Electronically Signed   By: Sebastian Ache M.D.   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   - will narrow cefazolin 2gm IV, renally dosed  - please get TTE, eventually need TEE when stabilized - Thrombocytopenia slightly improved, thought to  Be due to sepsis since no shistocytes per dr Shirline Frees - repeat blood cx tomorrow  - given hx of ivdu - would check hiv and hep c.  AKi = continue to monitor and dose adjust abtx if needed  Dr Ninetta Lights to see tomorrow

## 2018-04-15 NOTE — Procedures (Signed)
Central Venous Catheter Insertion Procedure Note Frances Le [id number] [birth year]  Procedure: Insertion of Central Venous Catheter Indications: Assessment of intravascular volume, Drug and/or fluid administration and Frequent blood sampling  Procedure Details Consent: Unable to obtain consent because of emergent medical necessity. Time Out: Verified patient identification, verified procedure, site/side was marked, verified correct patient position, special equipment/implants available, medications/allergies/relevent history reviewed, required imaging and test results available.  Performed  Maximum sterile technique was used including antiseptics, cap, gloves, gown, hand hygiene, mask and sheet. Skin prep: Chlorhexidine; local anesthetic administered A antimicrobial bonded/coated triple lumen catheter was placed in the right internal jugular vein using the Seldinger technique.  U/S used in placement  Evaluation Blood flow good Complications: No apparent complications Patient did tolerate procedure well. Chest X-ray ordered to verify placement.  CXR: pending.  Frances Le 04/15/2018, 3:07 PM

## 2018-04-15 NOTE — Progress Notes (Signed)
Assessed LUE for PICC insertion.  PICC would occupy >45% of the veins.  Artery is now visible and easily distinguished.  Robin RN notified.  Robin to notify Dr Molli KnockYacoub.   Thank you for this consult.

## 2018-04-15 NOTE — Progress Notes (Signed)
Pharmacy Antibiotic Note  Frances GlasgowBertha M XXXSutton is a 32 y.o. female admitted on 18-May-2017 with sepsis , suspected endocarditis  Pending BCID  Plan: Zosyn 3.375 gm iv q8 Vanc 750 mg q24h Monitor renal fx cx vanc lvls prn  Height: 5\' 2"  (157.5 cm) Weight: 114 lb 10.2 oz (52 kg) IBW/kg (Calculated) : 50.1  Temp (24hrs), Avg:98.8 F (37.1 C), Min:97.7 F (36.5 C), Max:99.9 F (37.7 C)  Recent Labs  Lab 2018-04-16 1425 2018-04-16 1430 2018-04-16 1631 2018-04-16 2333 04/15/18 0250 04/15/18 0820  WBC 36.4*  --   --  35.0*  --   --   CREATININE 2.42*  --   --  1.91*  --  1.62*  LATICACIDVEN  --  5.12* 4.10* 3.9* 3.8*  --     Estimated Creatinine Clearance: 40.2 mL/min (A) (by C-G formula based on SCr of 1.62 mg/dL (H)).    No Known Allergies  Vancomycin 750 mg IV Q 24 hrs. Goal AUC 400-550. Expected AUC: 513 SCr used: 1.62  Isaac BlissMichael Denny Mccree, PharmD, BCPS, BCCCP Clinical Pharmacist [phone]  Please check AMION for all [facility] numbers  04/15/2018 9:59 AM

## 2018-04-15 NOTE — Progress Notes (Signed)
CRITICAL VALUE ALERT  Critical Value: Platelets, Lactic, & Troponin   Date & Time Notied:  04/15/2018 02:30  Provider Notified: Elink RN   Orders Received/Actions taken: Waiting for orders for Lactic, and Troponin. Will give 2 units of platelets now.

## 2018-04-15 NOTE — Progress Notes (Addendum)
Patient with likely endocarditis and low platelet, TLC can be placed for pressors but given high bleeding risk a PICC will be much safer.  Patient is already growing GPC's in the blood.  Discussed with PICC nurse and appreciate their cooperation.  Cultures are already positive.  Placed for medical necessity.  Alyson ReedyWesam G. Oliviah Agostini, M.D. [facility]. Pager: [phone]. After hours pager: [phone].

## 2018-04-16 ENCOUNTER — Inpatient Hospital Stay (HOSPITAL_COMMUNITY): Payer: Medicaid Other

## 2018-04-16 ENCOUNTER — Other Ambulatory Visit (HOSPITAL_COMMUNITY): Payer: Self-pay

## 2018-04-16 ENCOUNTER — Inpatient Hospital Stay (HOSPITAL_COMMUNITY): Payer: Self-pay

## 2018-04-16 DIAGNOSIS — F1123 Opioid dependence with withdrawal: Secondary | ICD-10-CM

## 2018-04-16 DIAGNOSIS — F13239 Sedative, hypnotic or anxiolytic dependence with withdrawal, unspecified: Secondary | ICD-10-CM

## 2018-04-16 DIAGNOSIS — G9340 Encephalopathy, unspecified: Secondary | ICD-10-CM

## 2018-04-16 DIAGNOSIS — J80 Acute respiratory distress syndrome: Secondary | ICD-10-CM

## 2018-04-16 DIAGNOSIS — D696 Thrombocytopenia, unspecified: Secondary | ICD-10-CM

## 2018-04-16 DIAGNOSIS — J189 Pneumonia, unspecified organism: Secondary | ICD-10-CM

## 2018-04-16 DIAGNOSIS — N179 Acute kidney failure, unspecified: Secondary | ICD-10-CM

## 2018-04-16 DIAGNOSIS — A419 Sepsis, unspecified organism: Secondary | ICD-10-CM

## 2018-04-16 DIAGNOSIS — R0902 Hypoxemia: Secondary | ICD-10-CM

## 2018-04-16 DIAGNOSIS — F1910 Other psychoactive substance abuse, uncomplicated: Secondary | ICD-10-CM

## 2018-04-16 DIAGNOSIS — J9690 Respiratory failure, unspecified, unspecified whether with hypoxia or hypercapnia: Secondary | ICD-10-CM

## 2018-04-16 DIAGNOSIS — R8281 Pyuria: Secondary | ICD-10-CM

## 2018-04-16 DIAGNOSIS — Z9289 Personal history of other medical treatment: Secondary | ICD-10-CM

## 2018-04-16 DIAGNOSIS — R6521 Severe sepsis with septic shock: Secondary | ICD-10-CM

## 2018-04-16 DIAGNOSIS — E43 Unspecified severe protein-calorie malnutrition: Secondary | ICD-10-CM

## 2018-04-16 DIAGNOSIS — F19939 Other psychoactive substance use, unspecified with withdrawal, unspecified: Secondary | ICD-10-CM

## 2018-04-16 DIAGNOSIS — M311 Thrombotic microangiopathy: Secondary | ICD-10-CM

## 2018-04-16 LAB — ADAMTS13 ACTIVITY REFLEX

## 2018-04-16 LAB — POCT I-STAT 3, ART BLOOD GAS (G3+)
Acid-base deficit: 12 mmol/L — ABNORMAL HIGH (ref 0.0–2.0)
Acid-base deficit: 12 mmol/L — ABNORMAL HIGH (ref 0.0–2.0)
Acid-base deficit: 10 mmol/L — ABNORMAL HIGH (ref 0.0–2.0)
Bicarbonate: 15.2 mmol/L — ABNORMAL LOW (ref 20.0–28.0)
Bicarbonate: 18.8 mmol/L — ABNORMAL LOW (ref 20.0–28.0)
Bicarbonate: 18 mmol/L — ABNORMAL LOW (ref 20.0–28.0)
O2 Saturation: 91 %
O2 Saturation: 90 %
O2 Saturation: 96 %
PH ART: 7.216 — AB (ref 7.350–7.450)
Patient temperature: 37.4
Patient temperature: 100
Patient temperature: 100
TCO2: 16 mmol/L — ABNORMAL LOW (ref 22–32)
TCO2: 21 mmol/L — AB (ref 22–32)
TCO2: 19 mmol/L — ABNORMAL LOW (ref 22–32)
pCO2 arterial: 37.6 mmHg (ref 32.0–48.0)
pCO2 arterial: 76.1 mmHg (ref 32.0–48.0)
pCO2 arterial: 51.2 mmHg — ABNORMAL HIGH (ref 32.0–48.0)
pH, Arterial: 7.007 — CL (ref 7.350–7.450)
pH, Arterial: 7.158 — CL (ref 7.350–7.450)
pO2, Arterial: 73 mmHg — ABNORMAL LOW (ref 83.0–108.0)
pO2, Arterial: 92 mmHg (ref 83.0–108.0)
pO2, Arterial: 110 mmHg — ABNORMAL HIGH (ref 83.0–108.0)

## 2018-04-16 LAB — BPAM PLATELET PHERESIS
Blood Product Expiration Date: 201912292359
Blood Product Expiration Date: 201912292359
ISSUE DATE / TIME: 201912290303
ISSUE DATE / TIME: 201912290618
Unit Type and Rh: 6200
Unit Type and Rh: 9500

## 2018-04-16 LAB — PREPARE PLATELET PHERESIS
Unit division: 0
Unit division: 0

## 2018-04-16 LAB — BLOOD GAS, ARTERIAL
Acid-base deficit: 9 mmol/L — ABNORMAL HIGH (ref 0.0–2.0)
Bicarbonate: 16.4 mmol/L — ABNORMAL LOW (ref 20.0–28.0)
Drawn by: 311011
FIO2: 60
MECHVT: 400 mL
O2 Saturation: 88.9 %
PEEP: 5 cmH2O
PO2 ART: 66.6 mmHg — AB (ref 83.0–108.0)
Patient temperature: 98.6
RATE: 18 resp/min
pCO2 arterial: 36.1 mmHg (ref 32.0–48.0)
pH, Arterial: 7.281 — ABNORMAL LOW (ref 7.350–7.450)

## 2018-04-16 LAB — GLUCOSE, CAPILLARY
GLUCOSE-CAPILLARY: 109 mg/dL — AB (ref 70–99)
Glucose-Capillary: 97 mg/dL (ref 70–99)
Glucose-Capillary: 104 mg/dL — ABNORMAL HIGH (ref 70–99)
Glucose-Capillary: 67 mg/dL — ABNORMAL LOW (ref 70–99)
Glucose-Capillary: 128 mg/dL — ABNORMAL HIGH (ref 70–99)
Glucose-Capillary: 88 mg/dL (ref 70–99)
Glucose-Capillary: 95 mg/dL (ref 70–99)

## 2018-04-16 LAB — CBC
HCT: 28.5 % — ABNORMAL LOW (ref 36.0–46.0)
HCT: 25.9 % — ABNORMAL LOW (ref 36.0–46.0)
Hemoglobin: 9.2 g/dL — ABNORMAL LOW (ref 12.0–15.0)
Hemoglobin: 8.1 g/dL — ABNORMAL LOW (ref 12.0–15.0)
MCH: 29.3 pg (ref 26.0–34.0)
MCH: 28.8 pg (ref 26.0–34.0)
MCHC: 32.3 g/dL (ref 30.0–36.0)
MCHC: 31.3 g/dL (ref 30.0–36.0)
MCV: 90.8 fL (ref 80.0–100.0)
MCV: 92.2 fL (ref 80.0–100.0)
Platelets: 7 10*3/uL — CL (ref 150–400)
Platelets: 56 10*3/uL — ABNORMAL LOW (ref 150–400)
RBC: 3.14 MIL/uL — ABNORMAL LOW (ref 3.87–5.11)
RBC: 2.81 MIL/uL — AB (ref 3.87–5.11)
RDW: 15.7 % — ABNORMAL HIGH (ref 11.5–15.5)
RDW: 15.7 % — ABNORMAL HIGH (ref 11.5–15.5)
WBC: 33.3 10*3/uL — ABNORMAL HIGH (ref 4.0–10.5)
WBC: 58.8 10*3/uL (ref 4.0–10.5)
nRBC: 1.6 % — ABNORMAL HIGH (ref 0.0–0.2)
nRBC: 1.4 % — ABNORMAL HIGH (ref 0.0–0.2)

## 2018-04-16 LAB — BASIC METABOLIC PANEL
Anion gap: 11 (ref 5–15)
Anion gap: 15 (ref 5–15)
BUN: 76 mg/dL — AB (ref 6–20)
BUN: 80 mg/dL — ABNORMAL HIGH (ref 6–20)
CALCIUM: 7.3 mg/dL — AB (ref 8.9–10.3)
CO2: 16 mmol/L — ABNORMAL LOW (ref 22–32)
CO2: 18 mmol/L — ABNORMAL LOW (ref 22–32)
Calcium: 7.4 mg/dL — ABNORMAL LOW (ref 8.9–10.3)
Chloride: 114 mmol/L — ABNORMAL HIGH (ref 98–111)
Chloride: 109 mmol/L (ref 98–111)
Creatinine, Ser: 1.6 mg/dL — ABNORMAL HIGH (ref 0.44–1.00)
Creatinine, Ser: 1.89 mg/dL — ABNORMAL HIGH (ref 0.44–1.00)
GFR calc Af Amer: 50 mL/min — ABNORMAL LOW (ref >60)
GFR calc Af Amer: 41 mL/min — ABNORMAL LOW (ref >60)
GFR calc non Af Amer: 43 mL/min — ABNORMAL LOW (ref >60)
GFR calc non Af Amer: 35 mL/min — ABNORMAL LOW (ref >60)
GLUCOSE: 101 mg/dL — AB (ref 70–99)
Glucose, Bld: 93 mg/dL (ref 70–99)
Potassium: 4.3 mmol/L (ref 3.5–5.1)
Potassium: 5.5 mmol/L — ABNORMAL HIGH (ref 3.5–5.1)
Sodium: 141 mmol/L (ref 135–145)
Sodium: 142 mmol/L (ref 135–145)

## 2018-04-16 LAB — LEGIONELLA PNEUMOPHILA SEROGP 1 UR AG
L. pneumophila Serogp 1 Ur Ag: NEGATIVE
L. pneumophila Serogp 1 Ur Ag: NEGATIVE

## 2018-04-16 LAB — PHOSPHORUS
PHOSPHORUS: 5.4 mg/dL — AB (ref 2.5–4.6)
Phosphorus: 10.6 mg/dL — ABNORMAL HIGH (ref 2.5–4.6)

## 2018-04-16 LAB — RH IG WORKUP (INCLUDES ABO/RH)
ABO/RH(D): O NEG
Gestational Age(Wks): 0
Unit division: 0
Unit tag comment: 0

## 2018-04-16 LAB — COMPREHENSIVE METABOLIC PANEL
ALT: 23 U/L (ref 0–44)
AST: 68 U/L — ABNORMAL HIGH (ref 15–41)
Albumin: 1.4 g/dL — ABNORMAL LOW (ref 3.5–5.0)
Alkaline Phosphatase: 247 U/L — ABNORMAL HIGH (ref 38–126)
Anion gap: 13 (ref 5–15)
BUN: 77 mg/dL — ABNORMAL HIGH (ref 6–20)
CHLORIDE: 113 mmol/L — AB (ref 98–111)
CO2: 15 mmol/L — ABNORMAL LOW (ref 22–32)
Calcium: 7.4 mg/dL — ABNORMAL LOW (ref 8.9–10.3)
Creatinine, Ser: 1.92 mg/dL — ABNORMAL HIGH (ref 0.44–1.00)
GFR calc Af Amer: 40 mL/min — ABNORMAL LOW (ref >60)
GFR calc non Af Amer: 34 mL/min — ABNORMAL LOW (ref >60)
Glucose, Bld: 70 mg/dL (ref 70–99)
Potassium: 3.7 mmol/L (ref 3.5–5.1)
Sodium: 141 mmol/L (ref 135–145)
Total Bilirubin: 7.6 mg/dL — ABNORMAL HIGH (ref 0.3–1.2)
Total Protein: 4.6 g/dL — ABNORMAL LOW (ref 6.5–8.1)

## 2018-04-16 LAB — MAGNESIUM
MAGNESIUM: 2.5 mg/dL — AB (ref 1.7–2.4)
Magnesium: 2.6 mg/dL — ABNORMAL HIGH (ref 1.7–2.4)

## 2018-04-16 LAB — HEPATITIS PANEL, ACUTE
HCV Ab: >11 s/co ratio — ABNORMAL HIGH (ref 0.0–0.9)
Hep A IgM: NEGATIVE
Hep B C IgM: NEGATIVE
Hepatitis B Surface Ag: NEGATIVE

## 2018-04-16 LAB — LACTIC ACID, PLASMA: Lactic Acid, Venous: 7.3 mmol/L (ref 0.5–1.9)

## 2018-04-16 LAB — HIV ANTIBODY (ROUTINE TESTING W REFLEX): HIV Screen 4th Generation wRfx: NONREACTIVE

## 2018-04-16 LAB — PATHOLOGIST SMEAR REVIEW

## 2018-04-16 LAB — ADAMTS13 ACTIVITY: Adamts 13 Activity: 32.3 % — ABNORMAL LOW (ref >66.8)

## 2018-04-16 MED ORDER — HYDROCORTISONE NA SUCCINATE PF 100 MG IJ SOLR
50.0000 mg | Freq: Four times a day (QID) | INTRAMUSCULAR | Status: DC
  Administered 2018-04-16 – 2018-04-19 (×14): 50 mg via INTRAVENOUS
  Filled 2018-04-16 (×14): qty 2

## 2018-04-16 MED ORDER — HEPARIN SODIUM (PORCINE) 1000 UNIT/ML DIALYSIS
1000.0000 [IU] | INTRAMUSCULAR | Status: DC | PRN
  Administered 2018-04-16: 3000 [IU] via INTRAVENOUS_CENTRAL
  Filled 2018-04-16 (×2): qty 6
  Filled 2018-04-16: qty 3

## 2018-04-16 MED ORDER — ORAL CARE MOUTH RINSE
15.0000 mL | OROMUCOSAL | Status: DC
  Administered 2018-04-16 – 2018-04-19 (×30): 15 mL via OROMUCOSAL

## 2018-04-16 MED ORDER — SODIUM BICARBONATE 8.4 % IV SOLN
INTRAVENOUS | Status: AC
  Administered 2018-04-16: 100 meq via INTRAVENOUS
  Filled 2018-04-16: qty 100

## 2018-04-16 MED ORDER — CISATRACURIUM BOLUS VIA INFUSION
5.0000 mg | Freq: Once | INTRAVENOUS | Status: AC
  Administered 2018-04-16: 5 mg via INTRAVENOUS
  Filled 2018-04-16: qty 5

## 2018-04-16 MED ORDER — NOREPINEPHRINE 16 MG/250ML-% IV SOLN
0.0000 ug/min | INTRAVENOUS | Status: DC
  Administered 2018-04-16 (×3): 40 ug/min via INTRAVENOUS
  Administered 2018-04-17: 38 ug/min via INTRAVENOUS
  Administered 2018-04-17 (×2): 40 ug/min via INTRAVENOUS
  Administered 2018-04-18: 10 ug/min via INTRAVENOUS
  Administered 2018-04-18: 25 ug/min via INTRAVENOUS
  Filled 2018-04-16 (×9): qty 250

## 2018-04-16 MED ORDER — CEFAZOLIN SODIUM-DEXTROSE 2-4 GM/100ML-% IV SOLN
2.0000 g | Freq: Two times a day (BID) | INTRAVENOUS | Status: DC
  Administered 2018-04-16 – 2018-04-19 (×6): 2 g via INTRAVENOUS
  Filled 2018-04-16 (×6): qty 100

## 2018-04-16 MED ORDER — ALBUMIN HUMAN 25 % IV SOLN
25.0000 g | Freq: Once | INTRAVENOUS | Status: AC
  Administered 2018-04-16: 25 g via INTRAVENOUS
  Filled 2018-04-16: qty 100

## 2018-04-16 MED ORDER — SODIUM CHLORIDE 0.9 % IV SOLN
3.0000 ug/kg/min | INTRAVENOUS | Status: DC
  Administered 2018-04-16 – 2018-04-17 (×2): 3 ug/kg/min via INTRAVENOUS
  Filled 2018-04-16 (×3): qty 20

## 2018-04-16 MED ORDER — METRONIDAZOLE IN NACL 5-0.79 MG/ML-% IV SOLN
500.0000 mg | Freq: Three times a day (TID) | INTRAVENOUS | Status: DC
  Administered 2018-04-16 – 2018-04-19 (×10): 500 mg via INTRAVENOUS
  Filled 2018-04-16 (×12): qty 100

## 2018-04-16 MED ORDER — DOPAMINE-DEXTROSE 3.2-5 MG/ML-% IV SOLN
0.0000 ug/kg/min | INTRAVENOUS | Status: DC
  Administered 2018-04-16: 15 ug/kg/min via INTRAVENOUS
  Filled 2018-04-16: qty 250

## 2018-04-16 MED ORDER — FENTANYL CITRATE (PF) 100 MCG/2ML IJ SOLN: 100.0000 ug | Freq: Once | INTRAMUSCULAR | Status: DC | PRN

## 2018-04-16 MED ORDER — MIDAZOLAM HCL 2 MG/2ML IJ SOLN
2.0000 mg | Freq: Once | INTRAMUSCULAR | Status: AC | PRN
  Administered 2018-04-16: 2 mg via INTRAVENOUS

## 2018-04-16 MED ORDER — PRISMASOL BGK 4/2.5 32-4-2.5 MEQ/L IV SOLN
INTRAVENOUS | Status: DC
  Administered 2018-04-16 – 2018-04-19 (×21): via INTRAVENOUS_CENTRAL
  Filled 2018-04-16 (×27): qty 5000

## 2018-04-16 MED ORDER — SODIUM BICARBONATE 8.4 % IV SOLN
100.0000 meq | Freq: Once | INTRAVENOUS | Status: AC
  Administered 2018-04-16: 100 meq via INTRAVENOUS

## 2018-04-16 MED ORDER — SODIUM CHLORIDE 0.9% IV SOLUTION
Freq: Once | INTRAVENOUS | Status: AC
  Administered 2018-04-16: 02:00:00 via INTRAVENOUS

## 2018-04-16 MED ORDER — EPINEPHRINE PF 1 MG/ML IJ SOLN
0.5000 ug/min | INTRAVENOUS | Status: DC
  Filled 2018-04-16: qty 4

## 2018-04-16 MED ORDER — ARTIFICIAL TEARS OPHTHALMIC OINT
1.0000 "application " | TOPICAL_OINTMENT | Freq: Three times a day (TID) | OPHTHALMIC | Status: DC
  Administered 2018-04-16 – 2018-04-18 (×6): 1 via OPHTHALMIC
  Filled 2018-04-16: qty 3.5

## 2018-04-16 MED ORDER — MIDAZOLAM BOLUS VIA INFUSION
2.0000 mg | INTRAVENOUS | Status: DC | PRN
  Administered 2018-04-16: 2 mg via INTRAVENOUS
  Filled 2018-04-16: qty 2

## 2018-04-16 MED ORDER — VITAL AF 1.2 CAL PO LIQD
1000.0000 mL | ORAL | Status: DC
  Administered 2018-04-16 – 2018-04-17 (×2): 1000 mL

## 2018-04-16 MED ORDER — FENTANYL BOLUS VIA INFUSION
50.0000 ug | INTRAVENOUS | Status: DC | PRN
  Administered 2018-04-16: 50 ug via INTRAVENOUS
  Filled 2018-04-16: qty 50

## 2018-04-16 MED ORDER — SODIUM BICARBONATE 8.4 % IV SOLN
INTRAVENOUS | Status: DC
  Administered 2018-04-16: 05:00:00 via INTRAVENOUS
  Filled 2018-04-16 (×2): qty 150

## 2018-04-16 MED ORDER — DEXTROSE 50 % IV SOLN
INTRAVENOUS | Status: AC
  Administered 2018-04-16: 25 mL via INTRAVENOUS
  Filled 2018-04-16: qty 50

## 2018-04-16 MED ORDER — PRISMASOL BGK 4/2.5 32-4-2.5 MEQ/L REPLACEMENT SOLN
Status: DC
  Administered 2018-04-16 – 2018-04-19 (×4): via INTRAVENOUS_CENTRAL
  Filled 2018-04-16 (×5): qty 5000

## 2018-04-16 MED ORDER — PANTOPRAZOLE SODIUM 40 MG PO PACK
40.0000 mg | PACK | Freq: Every day | ORAL | Status: DC
  Administered 2018-04-16 – 2018-04-18 (×3): 40 mg
  Filled 2018-04-16 (×3): qty 20

## 2018-04-16 MED ORDER — CHLORHEXIDINE GLUCONATE 0.12% ORAL RINSE (MEDLINE KIT)
15.0000 mL | Freq: Two times a day (BID) | OROMUCOSAL | Status: DC
  Administered 2018-04-16 – 2018-04-19 (×6): 15 mL via OROMUCOSAL

## 2018-04-16 MED ORDER — LACTATED RINGERS IV SOLN
INTRAVENOUS | Status: DC
  Administered 2018-04-16: 11:00:00 via INTRAVENOUS

## 2018-04-16 MED ORDER — SODIUM CHLORIDE 0.9 % IV SOLN
2.0000 mg/h | INTRAVENOUS | Status: DC
  Administered 2018-04-16: 5 mg/h via INTRAVENOUS
  Administered 2018-04-16: 8 mg/h via INTRAVENOUS
  Filled 2018-04-16 (×2): qty 10

## 2018-04-16 MED ORDER — DEXTROSE 50 % IV SOLN
25.0000 mL | Freq: Once | INTRAVENOUS | Status: AC
  Administered 2018-04-16: 25 mL via INTRAVENOUS

## 2018-04-16 MED ORDER — FENTANYL 2500MCG IN NS 250ML (10MCG/ML) PREMIX INFUSION
100.0000 ug/h | INTRAVENOUS | Status: DC
  Administered 2018-04-16 (×2): 300 ug/h via INTRAVENOUS
  Administered 2018-04-17 – 2018-04-18 (×3): 200 ug/h via INTRAVENOUS
  Filled 2018-04-16 (×4): qty 250

## 2018-04-16 NOTE — Consult Note (Signed)
[facility] Renal Consultation Note  Requesting MD: McQuaid Indication for Consultation: AKI, acidosis  Chief complaint: AMS  HPI: Frances Le is a 32 y.o. female with a history of IV drug use and bipolar disorder who presented to the hospital from jail with AMS.  The patient was found to have MSSA bacteremia and has been in septic shock and hypotensive requiring 3 pressors.  Dopamine stopped this AM due to tachycardia.  Of note, the patient also has been worked up for thrombocytopenia concerning for DIC or possible TTP.  TTP has been felt less likely per team.  Work-up notable for hepatitis C ab positive.  HIV non-reactive.  Urine pregnancy negative.  UA negative for protein and 0-5 RBC.    Consents have been obtained on emergent basis.  She has been incarcerated.  The ICU has been updating her aunt.  Discussed with her aunt.   Creatinine  Date/Time Value Ref Range Status  07/11/2014 11:25 AM 0.54 mg/dL Final    Comment:    0.98-1.190.44-1.00 NOTE: New Reference Range  06/24/14    Creatinine, Ser  Date/Time Value Ref Range Status  04/16/2018 03:02 PM 1.89 (H) 0.44 - 1.00 mg/dL Final  14/78/295612/30/2019 21:3003:42 AM 1.92 (H) 0.44 - 1.00 mg/dL Final  86/57/846912/30/2019 62:9512:50 AM 1.60 (H) 0.44 - 1.00 mg/dL Final  28/41/324412/29/2019 01:0208:20 AM 1.62 (H) 0.44 - 1.00 mg/dL Final  72/53/66442019-09-24 03:4711:33 PM 1.91 (H) 0.44 - 1.00 mg/dL Final  42/59/56382019-09-24 75:6402:25 PM 2.42 (H) 0.44 - 1.00 mg/dL Final  33/29/518809/04/2017 41:6604:52 AM 0.70 0.44 - 1.00 mg/dL Final  06/30/160108/02/2018 09:3204:16 PM 0.74 0.44 - 1.00 mg/dL Final  35/57/322006/23/2019 25:4212:10 PM 0.73 0.44 - 1.00 mg/dL Final  70/62/376211/01/2017 83:1504:12 PM 0.66 0.44 - 1.00 mg/dL Final  17/61/607303/03/2017 71:0612:06 AM 0.65 0.44 - 1.00 mg/dL Final     PMHx:   Past Medical History:  Diagnosis Date  . Anxiety   . Bipolar 1 disorder (HCC)   . Depression   . Drug abuse (HCC)   . Pregnant     Past Surgical History:  Procedure Laterality Date  . CESAREAN SECTION      Family Hx: No family history on file. unable  to obtain 2/2 AMS  Social History:  reports that she has been smoking cigarettes. She has been smoking about 1.00 pack per day. She has never used smokeless tobacco. She reports current alcohol use. She reports current drug use. Drugs: IV and Methamphetamines.  Unable to obtain directly from patient 2/2 AMS  Allergies: No Known Allergies  Medications: Prior to Admission medications   Medication Sig Start Date End Date Taking? Authorizing Provider  ondansetron (ZOFRAN ODT) 4 MG disintegrating tablet Take 1 tablet (4 mg total) by mouth every 8 (eight) hours as needed for nausea or vomiting. 11/26/17   Don PerkingVeronese, [place], MD  QUEtiapine (SEROQUEL) 50 MG tablet Take 1 tablet (50 mg total) by mouth at bedtime. 12/17/17   Sharyn CreamerQuale, Mark, MD    I have reviewed the patient's current medications.  Labs:  BMP Latest Ref Rng & Units 04/16/2018 04/16/2018 04/16/2018  Glucose 70 - 99 mg/dL 269(S101(H) 70 93  BUN 6 - 20 mg/dL 85(I80(H) 62(V77(H) 03(J76(H)  Creatinine 0.44 - 1.00 mg/dL 0.09(F1.89(H) 8.18(E1.92(H) 9.93(Z1.60(H)  Sodium 135 - 145 mmol/L 142 141 141  Potassium 3.5 - 5.1 mmol/L 5.5(H) 3.7 4.3  Chloride 98 - 111 mmol/L 109 113(H) 114(H)  CO2 22 - 32 mmol/L 18(L) 15(L) 16(L)  Calcium 8.9 - 10.3 mg/dL 7.3(L) 7.4(L) 7.4(L)  Urinalysis    Component Value Date/Time   COLORURINE AMBER (A) 28-Nov-2017 1941   APPEARANCEUR HAZY (A) 28-Nov-2017 1941   APPEARANCEUR Clear 01/23/2012 0138   LABSPEC 1.014 28-Nov-2017 1941   LABSPEC 1.020 01/23/2012 0138   PHURINE 5.0 28-Nov-2017 1941   GLUCOSEU NEGATIVE 28-Nov-2017 1941   GLUCOSEU Negative 01/23/2012 0138   HGBUR MODERATE (A) 28-Nov-2017 1941   BILIRUBINUR NEGATIVE 28-Nov-2017 1941   BILIRUBINUR Negative 01/23/2012 0138   KETONESUR NEGATIVE 28-Nov-2017 1941   PROTEINUR NEGATIVE 28-Nov-2017 1941   UROBILINOGEN 2.0 (H) 02/21/2013 0055   NITRITE POSITIVE (A) 28-Nov-2017 1941   LEUKOCYTESUR TRACE (A) 28-Nov-2017 1941   LEUKOCYTESUR Negative 01/23/2012 0138     ROS: Unable to obtain 2/2  AMS  Physical Exam: Vitals:   04/16/18 1715 04/16/18 1730  BP:    Pulse: (!) 125 (!) 127  Resp: (!) 30 (!) 30  Temp:    SpO2: 100% 100%     General: adult female intubated critically ill  HEENT: NCAT; oral mucosa bleeding and being suctioned Eyes:does not track  Heart:tachycardic  Lungs: coarse mechanical breath sounds Abdomen: soft but distended  Extremities: no pitting edema Skin:toes with cyanosis Neuro: paralyzed and sedated  Assessment/Plan:  # AKI  - Secondary to ischemic ATN as well as possible emboli in setting of MSSA bacteremia  - Discussed with critical care.  Will start CVVHD.  They have placed a non-tunneled line  - Will set fluid removal goal as 0 ml/hr for now in the setting of active septic shock with reassessment on 12/31 AM  # Metabolic acidosis  - initiating CRRT and treating shock as below  # Septic shock - on abx for bacteremia as well as norepi, vasopressin, and giapreza.  No fluid removal for now.  Also possible concern for pyelo  # MSSA bacteremia - on cefazolin. 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

## 2018-04-16 NOTE — Progress Notes (Addendum)
eLink Physician-Brief Progress Note Patient Name: Normajean GlasgowBertha M XXXSutton DOB: 1985-10-15 MRN: [id number]   Date of Service  04/16/2018  HPI/Events of Note  Notified of platelet count of 7. No sign of active bleeding. Remains on pressors  eICU Interventions  Transfuse 1 unit apharesed platelets.  Concern for infective endocarditis however not currently stable for surgery. 2D echo pending        Darl Pikesmily T Donie Moulton 04/16/2018, 1:42 AM

## 2018-04-16 NOTE — Progress Notes (Signed)
eLink Physician-Brief Progress Note Patient Name: Frances GlasgowBertha M XXXSutton DOB: 01-21-1986 MRN: [id number]   Date of Service  04/16/2018  HPI/Events of Note  Notified of increased work of breathing and increasing FiO2 requirement as well as pressor requirement.  eICU Interventions  ABG, CXR ordered. Bedside team informed     Intervention Category Major Interventions: Respiratory failure - evaluation and management  Darl Pikesmily T Jahn Franchini 04/16/2018, 12:29 AM

## 2018-04-16 NOTE — Progress Notes (Signed)
Dr Kendrick FriesMcQuaid called with ABG results New orders to increase RR to 30

## 2018-04-16 NOTE — Progress Notes (Signed)
ABG results called to Dr Kendrick FriesMcQuaid no change ordered.

## 2018-04-16 NOTE — Progress Notes (Signed)
INFECTIOUS DISEASE PROGRESS NOTE  ID: Frances Le is a 32 y.o. female with  Active Problems:   T.T.P. syndrome ([place])   Sepsis ([place])   Septic shock ([place])  Subjective: On vent, pressors  Abtx:  Anti-infectives (From admission, onward)   Start     Dose/Rate Route Frequency Ordered Stop   04/16/18 0447  metroNIDAZOLE (FLAGYL) IVPB 500 mg     500 mg 100 mL/hr over 60 Minutes Intravenous Every 8 hours 04/16/18 0447     04/15/18 1800  azithromycin (ZITHROMAX) 500 mg in sodium chloride 0.9 % 250 mL IVPB  Status:  Discontinued     500 mg 250 mL/hr over 60 Minutes Intravenous Every 24 hours 03/20/2018 2111 04/15/18 0937   04/15/18 1200  ceFAZolin (ANCEF) IVPB 2g/100 mL premix     2 g 200 mL/hr over 30 Minutes Intravenous Every 8 hours 04/15/18 1106     04/15/18 1000  cefTRIAXone (ROCEPHIN) 1 g in sodium chloride 0.9 % 100 mL IVPB  Status:  Discontinued     1 g 200 mL/hr over 30 Minutes Intravenous Every 24 hours 04/04/2018 2111 04/15/18 0937   04/15/18 1000  piperacillin-tazobactam (ZOSYN) IVPB 3.375 g  Status:  Discontinued     3.375 g 12.5 mL/hr over 240 Minutes Intravenous Every 8 hours 04/15/18 0958 04/15/18 1106   04/15/18 1000  vancomycin (VANCOCIN) IVPB 750 mg/150 ml premix  Status:  Discontinued     750 mg 150 mL/hr over 60 Minutes Intravenous Every 24 hours 04/15/18 0958 04/15/18 1106   04/15/18 0000  vancomycin (VANCOCIN) IVPB 1000 mg/200 mL premix  Status:  Discontinued     1,000 mg 200 mL/hr over 60 Minutes Intravenous Every 24 hours 03/19/2018 2111 04/04/2018 2346   03/26/2018 1815  vancomycin (VANCOCIN) IVPB 1000 mg/200 mL premix     1,000 mg 200 mL/hr over 60 Minutes Intravenous  Once 04/01/2018 1806 03/29/2018 1927   04/16/2018 1700  cefTRIAXone (ROCEPHIN) 1 g in sodium chloride 0.9 % 100 mL IVPB     1 g 200 mL/hr over 30 Minutes Intravenous  Once 03/31/2018 1650 03/27/2018 1802   04/15/2018 1700  azithromycin (ZITHROMAX) 500 mg in sodium chloride 0.9 % 250 mL IVPB     500  mg 250 mL/hr over 60 Minutes Intravenous  Once 03/23/2018 1650 03/18/2018 1905      Medications:  Scheduled: . aspirin  150 mg Rectal Daily  . chlorhexidine gluconate (MEDLINE KIT)  15 mL Mouth Rinse BID  . feeding supplement (PRO-STAT SUGAR FREE 64)  30 mL Per Tube BID  . feeding supplement (VITAL HIGH PROTEIN)  1,000 mL Per Tube Q24H  . hydrocortisone sod succinate (SOLU-CORTEF) inj  50 mg Intravenous Q6H  . mouth rinse  15 mL Mouth Rinse 10 times per day  . pantoprazole (PROTONIX) IV  40 mg Intravenous QHS    Objective: Vital signs in last 24 hours: Temp:  [94.3 F (34.6 C)-101.1 F (38.4 C)] 99.7 F (37.6 C) (12/30 0845) Pulse Rate:  [37-146] 146 (12/30 0845) Resp:  [14-49] 25 (12/30 0845) BP: (89-137)/(47-66) 137/66 (12/30 0830) SpO2:  [76 %-100 %] 90 % (12/30 0845) Arterial Line BP: (77-148)/(30-72) 109/54 (12/30 0845) FiO2 (%):  [40 %-80 %] 80 % (12/30 0738) Weight:  [58.6 kg] 58.6 kg (12/30 0403)   General appearance: intubated, sedated Resp: diminished breath sounds anterior - bilateral Cardio: tachycardia GI: normal findings: soft and abnormal findings:  hypoactive bowel sounds Skin: multiple areas of bruising.   Lab  Results Recent Labs    04/16/18 0050 04/16/18 0342  WBC 33.3* 58.8*  HGB 9.2* 8.1*  HCT 28.5* 25.9*  NA 141 141  K 4.3 3.7  CL 114* 113*  CO2 16* 15*  BUN 76* 77*  CREATININE 1.60* 1.92*   Liver Panel Recent Labs    03/20/2018 2333 04/16/18 0342  PROT 4.6* 4.6*  ALBUMIN 1.5* 1.4*  AST 52* 68*  ALT 29 23  ALKPHOS 129* 247*  BILITOT 5.7* 7.6*   Sedimentation Rate No results for input(s): ESRSEDRATE in the last 72 hours. C-Reactive Protein No results for input(s): CRP in the last 72 hours.  Microbiology: Recent Results (from the past 240 hour(s))  Culture, blood (Routine x 2)     Status: None (Preliminary result)   Collection Time: 04/16/2018  2:25 PM  Result Value Ref Range Status   Specimen Description   Final    BLOOD LEFT  FOREARM Performed at [facility], [address] [place], [place] [address]    Special Requests   Final    BOTTLES DRAWN AEROBIC AND ANAEROBIC Blood Culture adequate volume Performed at [facility], [address] [place], [place] [address]    Culture  Setup Time   Final    GRAM POSITIVE COCCI Gram Stain Report Called to,Read Back By and Verified With: BRYK V. ANAEROBIC @ 0735 ON [id number] BY HEDNERSON L. CRITICAL RESULT CALLED TO, READ BACK BY AND VERIFIED WITH: M. Prentiss Bells, PHARMD AT 1055 ON 04/15/18 BY C. JESSUP, MLT. Performed at [facility], [place] [address] [place], [place] [address]    Culture PENDING  Incomplete   Report Status PENDING  Incomplete  Culture, blood (Routine x 2)     Status: None (Preliminary result)   Collection Time: 03/31/2018  2:25 PM  Result Value Ref Range Status   Specimen Description   Final    BLOOD LEFT FOREARM Performed at [facility], [address] [place], [place] [address]    Special Requests   Final    BOTTLES DRAWN AEROBIC AND ANAEROBIC Blood Culture adequate volume Performed at [facility], [address] [place], [place] [address]    Culture  Setup Time   Final    GRAM POSITIVE COCCI Gram Stain Report Called to,Read Back By and Verified With: [place] @ 0240 ON [id number] HENDERSON L. CRITICAL RESULT CALLED TO, READ BACK BY AND VERIFIED WITH: M. Prentiss Bells, PHARMD AT 1055 ON 04/15/18 BY C. JESSUP, MLT. Performed at [facility], [place] [address] [place], [place] [address]    Culture GRAM POSITIVE COCCI  Final   Report Status PENDING  Incomplete  Blood Culture ID Panel (Reflexed)     Status: Abnormal   Collection Time: 03/30/2018  2:25 PM  Result Value Ref Range Status   Enterococcus species NOT DETECTED NOT DETECTED Final   Listeria monocytogenes NOT DETECTED NOT DETECTED Final   Staphylococcus species DETECTED (A) NOT DETECTED Final    Comment: CRITICAL RESULT CALLED TO, READ BACK BY AND VERIFIED WITH: M. Prentiss Bells, PHARMD AT  1055 ON 04/15/18 BY C. JESSUP, MLT.    Staphylococcus aureus (BCID) DETECTED (A) NOT DETECTED Final    Comment: Methicillin (oxacillin) susceptible Staphylococcus aureus (MSSA). Preferred therapy is anti staphylococcal beta lactam antibiotic (Cefazolin or Nafcillin), unless clinically contraindicated. CRITICAL RESULT CALLED TO, READ BACK BY AND VERIFIED WITH: M. Prentiss Bells, PHARMD AT 1055 ON 04/15/18 BY C. JESSUP, MLT.    Methicillin resistance NOT DETECTED NOT DETECTED Final  Streptococcus species NOT DETECTED NOT DETECTED Final   Streptococcus agalactiae NOT DETECTED NOT DETECTED Final   Streptococcus pneumoniae NOT DETECTED NOT DETECTED Final   Streptococcus pyogenes NOT DETECTED NOT DETECTED Final   Acinetobacter baumannii NOT DETECTED NOT DETECTED Final   Enterobacteriaceae species NOT DETECTED NOT DETECTED Final   Enterobacter cloacae complex NOT DETECTED NOT DETECTED Final   Escherichia coli NOT DETECTED NOT DETECTED Final   Klebsiella oxytoca NOT DETECTED NOT DETECTED Final   Klebsiella pneumoniae NOT DETECTED NOT DETECTED Final   Proteus species NOT DETECTED NOT DETECTED Final   Serratia marcescens NOT DETECTED NOT DETECTED Final   Haemophilus influenzae NOT DETECTED NOT DETECTED Final   Neisseria meningitidis NOT DETECTED NOT DETECTED Final   Pseudomonas aeruginosa NOT DETECTED NOT DETECTED Final   Candida albicans NOT DETECTED NOT DETECTED Final   Candida glabrata NOT DETECTED NOT DETECTED Final   Candida krusei NOT DETECTED NOT DETECTED Final   Candida parapsilosis NOT DETECTED NOT DETECTED Final   Candida tropicalis NOT DETECTED NOT DETECTED Final    Comment: Performed at [facility], [place] [address], [place], [place] [address]  Urine culture     Status: Abnormal (Preliminary result)   Collection Time: 04/04/2018  8:54 PM  Result Value Ref Range Status   Specimen Description URINE, CATHETERIZED  Final   Special Requests   Final    NONE Performed at [facility], [place] [address], [place], [place] [address]    Culture (A)  Final    >=100,000 COLONIES/mL GRAM NEGATIVE RODS 70,000 COLONIES/mL STAPHYLOCOCCUS AUREUS CULTURE REINCUBATED FOR BETTER GROWTH    Report Status PENDING  Incomplete  MRSA PCR Screening     Status: None   Collection Time: 04/13/2018  8:57 PM  Result Value Ref Range Status   MRSA by PCR NEGATIVE NEGATIVE Final    Comment:        The GeneXpert MRSA Assay (FDA approved for NASAL specimens only), is one component of a comprehensive MRSA colonization surveillance program. It is not intended to diagnose MRSA infection nor to guide or monitor treatment for MRSA infections. Performed at [facility], [place] [address] [place], [place] [address]   Respiratory Panel by PCR     Status: None   Collection Time: 04/16/2018  9:03 PM  Result Value Ref Range Status   Adenovirus NOT DETECTED NOT DETECTED Final   Coronavirus 229E NOT DETECTED NOT DETECTED Final   Coronavirus HKU1 NOT DETECTED NOT DETECTED Final   Coronavirus NL63 NOT DETECTED NOT DETECTED Final   Coronavirus OC43 NOT DETECTED NOT DETECTED Final   Metapneumovirus NOT DETECTED NOT DETECTED Final   Rhinovirus / Enterovirus NOT DETECTED NOT DETECTED Final   Influenza A NOT DETECTED NOT DETECTED Final   Influenza B NOT DETECTED NOT DETECTED Final   Parainfluenza Virus 1 NOT DETECTED NOT DETECTED Final   Parainfluenza Virus 2 NOT DETECTED NOT DETECTED Final   Parainfluenza Virus 3 NOT DETECTED NOT DETECTED Final   Parainfluenza Virus 4 NOT DETECTED NOT DETECTED Final   Respiratory Syncytial Virus NOT DETECTED NOT DETECTED Final   Bordetella pertussis NOT DETECTED NOT DETECTED Final   Chlamydophila pneumoniae NOT DETECTED NOT DETECTED Final   Mycoplasma pneumoniae NOT DETECTED NOT DETECTED Final  Culture, blood (routine x 2)     Status: None (Preliminary result)   Collection Time: 04/12/2018 11:30 PM  Result Value Ref Range Status   Specimen Description   Final    BLOOD  RIGHT ARM Performed at [facility], [place] [address], [place], [place] [address]    Special Requests   Final    BOTTLES DRAWN AEROBIC ONLY Blood Culture results may not be optimal due to an inadequate volume of blood received in culture bottles   Culture PENDING  Incomplete   Report Status PENDING  Incomplete  Culture, blood (routine x 2)     Status: None (Preliminary result)   Collection Time: 03/20/2018 11:33 PM  Result Value Ref Range Status   Specimen Description   Final    BLOOD RIGHT HAND Performed at [facility], [place] [address], [place],  [address]    Special Requests   Final    BOTTLES DRAWN AEROBIC ONLY Blood Culture adequate volume   Culture PENDING  Incomplete   Report Status PENDING  Incomplete    Studies/Results: Ct Abdomen Pelvis Wo Contrast  Result Date: 04/15/2018 CLINICAL DATA:  Acute onset of sepsis. EXAM: CT CHEST, ABDOMEN AND PELVIS WITHOUT CONTRAST TECHNIQUE: Multidetector CT imaging of the chest, abdomen and pelvis was performed following the standard protocol without IV contrast. COMPARISON:  CT of the abdomen and pelvis performed 11/26/2017, and chest radiograph performed 03/28/2018 FINDINGS: CT CHEST FINDINGS Cardiovascular: The heart is normal in size. The thoracic aorta is grossly unremarkable, though difficult to fully assess without contrast. The great vessels are unremarkable in appearance. Mediastinum/Nodes: The mediastinum is unremarkable in appearance. No definite mediastinal lymphadenopathy is seen. No pericardial effusion is identified. The thyroid gland is grossly unremarkable. No axillary lymphadenopathy is appreciated. Lungs/Pleura: Scattered nodular airspace opacities are noted bilaterally, with larger airspace opacities on the right, measuring up to 5.6 cm in size. This is suspicious for septic emboli. Underlying diffuse interstitial prominence is noted, with trace bilateral pleural fluid, concerning for mild pulmonary edema. No  pneumothorax is seen. Musculoskeletal: No acute osseous abnormalities are identified. The visualized musculature is unremarkable in appearance. CT ABDOMEN PELVIS FINDINGS Hepatobiliary: The liver is unremarkable in appearance. The gallbladder is unremarkable in appearance. The common bile duct remains normal in caliber. Pancreas: The pancreas is within normal limits. Spleen: The spleen is unremarkable in appearance. Adrenals/Urinary Tract: The adrenal glands are unremarkable in appearance. There is question of haziness about the left renal pelvis, and mild left-sided perinephric stranding is seen. There is no evidence of hydronephrosis. No renal or ureteral stones are identified. Stomach/Bowel: The stomach is unremarkable in appearance. The small bowel is within normal limits. The appendix is normal in caliber, without evidence of appendicitis. There is question of mild wall thickening at the cecum and proximal ascending colon, which could reflect a mild infectious or inflammatory process. Mild diffuse mesenteric edema is noted. Vascular/Lymphatic: The abdominal aorta is grossly unremarkable, though not well assessed without contrast. No definite focal venous abnormalities are characterized, though the venous structures are also not well assessed. No definite retroperitoneal or pelvic sidewall lymphadenopathy is seen. Reproductive: The bladder is mildly distended, and contains air, secondary to Foley catheter placement. The uterus is grossly unremarkable in appearance. No suspicious adnexal masses are seen. Other: A small amount of ascites is seen within the pelvis. Musculoskeletal: No acute osseous abnormalities are identified. The visualized musculature is unremarkable in appearance. IMPRESSION: 1. Scattered nodular airspace opacities bilaterally, with larger airspace opacities on the right, measuring up to 5.6 cm in size. This is suspicious for diffuse septic emboli. 2. Underlying diffuse interstitial prominence,  with trace bilateral pleural fluid, concerning for mild pulmonary edema. 3. Question of mild  wall thickening at the cecum and proximal ascending colon, which could reflect a mild infectious or inflammatory process. 4. Mild diffuse mesenteric edema noted. 5. Question of haziness about the left renal pelvis, and mild left-sided perinephric stranding. Would correlate for any evidence of left-sided pyelonephritis. 6. Small amount of ascites within the pelvis. Electronically Signed   By: Garald Balding M.D.   On: 04/15/2018 01:01   Dg Chest 1 View  Addendum Date: 04/15/2018   ADDENDUM REPORT: 04/15/2018 16:12 ADDENDUM: Right IJ central line catheter terminates in the mid SVC. No pneumothorax. Electronically Signed   By: Ashley Royalty M.D.   On: 04/15/2018 16:12   Result Date: 04/15/2018 CLINICAL DATA:  Right-sided central line placement and endotracheal tube assessment. Substance abuse. EXAM: CHEST  1 VIEW COMPARISON:  None. FINDINGS: The patient is tilted to the left. Endotracheal tube tip is 2.7 cm above the carina. Gastric tube extends below the left hemidiaphragm though the tip and side port are excluded and not visualized on this study. Interval increase in pulmonary vascular congestion, left greater than right. Nodular masslike opacities project over the right mid and lower lobes suspicious for possible septic emboli given history of substance abuse. No acute osseous abnormality. IMPRESSION: 1. Endotracheal tube tip is 2.7 cm above the carina. 2. Gastric tube extends below the left hemidiaphragm though the tip and side port are excluded on this study. 3. Nodular masslike opacities project over the right mid and lower lobes, suspicious for possible septic emboli given history of substance abuse. 4. Interval increase in pulmonary vascular congestion more left-sided. Asymmetric pulmonary edema due to patient positioning might account this appearance. Electronically Signed: By: Ashley Royalty M.D. On: 04/15/2018  15:18   Dg Chest 1 View  Result Date: 03/25/2018 CLINICAL DATA:  Sepsis. Heroin withdrawal. EXAM: CHEST  1 VIEW COMPARISON:  11/26/2017 FINDINGS: Normal heart size and mediastinal contours. Confluent opacities are noted in both upper lobes and to a greater extent in the right lower lobe without cavitation. Multilobar pneumonia might account for this or potentially a septic emboli without cavitation. No effusion or edema. No bone destruction or fracture. IMPRESSION: Confluent opacities in both upper lobes and to a greater extent in the right lower lobe without cavitation. Multilobar pneumonia might account for this or potentially non cavitary septic emboli given patient history. Electronically Signed   By: Ashley Royalty M.D.   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 Altered mental status EXAM: CT HEAD WITHOUT CONTRAST TECHNIQUE: Contiguous axial images were obtained from the base of the skull through the vertex without intravenous contrast. COMPARISON:  None. FINDINGS: Brain: Ventricles and sulci are appropriate for patient's age. No evidence for acute cortically based infarct, intracranial hemorrhage, mass lesion or mass-effect. Vascular: Unremarkable Skull: Intact Sinuses/Orbits: Paranasal sinuses are well aerated. Mastoid air cells unremarkable. Orbits are unremarkable. Other: None. IMPRESSION: No acute intracranial process. Electronically Signed   By: Lovey Newcomer M.D.   On: 04/17/2018 15:21   Ct Chest Wo Contrast  Result Date: 04/15/2018 CLINICAL DATA:  Acute onset of sepsis. EXAM: CT CHEST, ABDOMEN AND PELVIS WITHOUT CONTRAST TECHNIQUE: Multidetector CT imaging of the chest, abdomen and pelvis was performed following the standard protocol without IV contrast. COMPARISON:  CT of the abdomen and pelvis performed 11/26/2017, and chest radiograph performed 04/09/2018 FINDINGS: CT CHEST FINDINGS Cardiovascular: The heart is normal  in size. The thoracic aorta is grossly unremarkable, though difficult  to fully assess without contrast. The great vessels are unremarkable in appearance. Mediastinum/Nodes: The mediastinum is unremarkable in appearance. No definite mediastinal lymphadenopathy is seen. No pericardial effusion is identified. The thyroid gland is grossly unremarkable. No axillary lymphadenopathy is appreciated. Lungs/Pleura: Scattered nodular airspace opacities are noted bilaterally, with larger airspace opacities on the right, measuring up to 5.6 cm in size. This is suspicious for septic emboli. Underlying diffuse interstitial prominence is noted, with trace bilateral pleural fluid, concerning for mild pulmonary edema. No pneumothorax is seen. Musculoskeletal: No acute osseous abnormalities are identified. The visualized musculature is unremarkable in appearance. CT ABDOMEN PELVIS FINDINGS Hepatobiliary: The liver is unremarkable in appearance. The gallbladder is unremarkable in appearance. The common bile duct remains normal in caliber. Pancreas: The pancreas is within normal limits. Spleen: The spleen is unremarkable in appearance. Adrenals/Urinary Tract: The adrenal glands are unremarkable in appearance. There is question of haziness about the left renal pelvis, and mild left-sided perinephric stranding is seen. There is no evidence of hydronephrosis. No renal or ureteral stones are identified. Stomach/Bowel: The stomach is unremarkable in appearance. The small bowel is within normal limits. The appendix is normal in caliber, without evidence of appendicitis. There is question of mild wall thickening at the cecum and proximal ascending colon, which could reflect a mild infectious or inflammatory process. Mild diffuse mesenteric edema is noted. Vascular/Lymphatic: The abdominal aorta is grossly unremarkable, though not well assessed without contrast. No definite focal venous abnormalities are characterized, though the venous  structures are also not well assessed. No definite retroperitoneal or pelvic sidewall lymphadenopathy is seen. Reproductive: The bladder is mildly distended, and contains air, secondary to Foley catheter placement. The uterus is grossly unremarkable in appearance. No suspicious adnexal masses are seen. Other: A small amount of ascites is seen within the pelvis. Musculoskeletal: No acute osseous abnormalities are identified. The visualized musculature is unremarkable in appearance. IMPRESSION: 1. Scattered nodular airspace opacities bilaterally, with larger airspace opacities on the right, measuring up to 5.6 cm in size. This is suspicious for diffuse septic emboli. 2. Underlying diffuse interstitial prominence, with trace bilateral pleural fluid, concerning for mild pulmonary edema. 3. Question of mild wall thickening at the cecum and proximal ascending colon, which could reflect a mild infectious or inflammatory process. 4. Mild diffuse mesenteric edema noted. 5. Question of haziness about the left renal pelvis, and mild left-sided perinephric stranding. Would correlate for any evidence of left-sided pyelonephritis. 6. Small amount of ascites within the pelvis. Electronically Signed   By: Garald Balding M.D.   On: 04/15/2018 01:01   Dg Chest Port 1 View  Result Date: 04/16/2018 CLINICAL DATA:  Acute onset of hypoxia. Decreased O2 saturation. EXAM: PORTABLE CHEST 1 VIEW COMPARISON:  Chest radiograph performed 04/15/2018 FINDINGS: The patient's endotracheal tube is seen ending 3 cm above the carina. An enteric tube is noted extending below the diaphragm. A right IJ line is noted ending about the mid SVC. Bilateral septic emboli are again noted. Underlying bibasilar airspace opacification may reflect pulmonary edema or pneumonia. Small bilateral pleural effusions are noted. No pneumothorax is seen. The cardiomediastinal silhouette is normal in size. No acute osseous abnormalities are identified. IMPRESSION: 1.  Endotracheal tube seen ending 3 cm above the carina. 2. Bilateral septic emboli again noted. Underlying bibasilar airspace opacification may reflect pulmonary edema or pneumonia. Small bilateral pleural effusions seen. Electronically Signed   By: Garald Balding M.D.   On: 04/16/2018 01:15   Dg Chest [facility] 1 View  Result  Date: 04/15/2018 CLINICAL DATA:  Droplet precautions. Hx ETT Pt was unresponsive Negative pregnancy test EXAM: PORTABLE CHEST 1 VIEW COMPARISON:  Chest x-ray 04/01/2018, CT of the chest on 04/15/2018 FINDINGS: Endotracheal tube is in place with tip approximately 2.9 centimeters above the carina. Nasogastric tube is in place, tip overlying the level of the stomach. Persistent significant lung opacities, including masslike densities, appear stable. IMPRESSION: 1. Stable appearance of the chest. 2. Endotracheal tube is in place with tip approximately 2.9 centimeters above the carina. Electronically Signed   By: Nolon Nations M.D.   On: 04/15/2018 10:51   Dg Chest Port 1 View  Result Date: 04/13/2018 CLINICAL DATA:  Sepsis.  History of heroin use. EXAM: PORTABLE CHEST 1 VIEW COMPARISON:  03/18/2018 at 1513 hours FINDINGS: The cardiomediastinal silhouette is unchanged with normal heart size. There are persistent confluent nodular/partly masslike opacities in the right lower lung which are similar to the prior study. Diffuse reticulonodular densities are again seen throughout both lungs, and there are Kerley lines noted peripherally in both lungs which are new from the prior study. No pleural effusion or pneumothorax is identified. IMPRESSION: Persistent confluent, nodular opacity in the right lower lung with reticulonodular densities diffusely throughout both lungs. Findings may reflect pneumonia and possibly septic emboli. Superimposed interstitial edema is also possible. Radiographic follow-up recommended to exclude underlying neoplasm. Electronically Signed   By: Logan Bores M.D.   On:  03/29/2018 21:59   [place] Ekg Site Rite  Result Date: 04/15/2018 If Site Rite image not attached, placement could not be confirmed due to current cardiac rhythm.    Assessment/Plan: Sepsis MSSA bacteremia (12-28 [place]) with septic emboli Polysubs abuse VDRF Thrombocytopenia secondary to sepsis Cecal/colonic thickening Pyuria Protein calorie malnutrition, severe Incarcerated  Total days of antibiotics: 3 ancef, 0 flagyl  Repeat BCx 12-28 and 12-30 pending Hepatitis serologies pending HIV pending Transfused PLT this am after dropping to 7. Now 58 Await UCx, continue ancef for this as well. Staph most likely from blood as primary, seeding urine. GNR ID pending.          Bobby Rumpf MD, FACP Infectious Diseases (pager) [phone] [email or web address] 04/16/2018, 9:08 AM  LOS: 2 days

## 2018-04-16 NOTE — Procedures (Signed)
Echo attempted. HR too high (147 BPM). Will attempt again later.

## 2018-04-16 NOTE — Progress Notes (Addendum)
Initial Nutrition Assessment  DOCUMENTATION CODES:   Not applicable  INTERVENTION:    Change TF to better meet estimated nutrition needs: Vital AF 1.2 at 60 ml/h (1440 ml per day)   Provides 1728 kcal, 108 gm protein, 1168 ml free water daily  NUTRITION DIAGNOSIS:   Inadequate oral intake related to inability to eat as evidenced by NPO status.  GOAL:   Patient will meet greater than or equal to 90% of their needs  MONITOR:   Vent status, TF tolerance, Labs, I & O's  REASON FOR ASSESSMENT:   Ventilator, Consult Enteral/tube feeding initiation and management  ASSESSMENT:   32 yo female with PMH of depression, anxiety, drug abuse, and Bipolar D/O who was transferred from [facility] ED on 12/28 for management of sepsis, staph bacteremia. Required intubation on 12/29.  Received MD Consult for TF initiation and management. OGT in place. MAP 74. Nimbex is being initiated today.   Currently receiving Vital High Protein at 40 ml/h with Pro-stat 30 ml BID, tolerating well.  Patient is currently intubated on ventilator support MV: 11.1 L/min Temp (24hrs), Avg:99.7 F (37.6 C), Min:94.3 F (34.6 C), Max:101.1 F (38.4 C)   Labs reviewed. Alk Phos 247 (H) CBG's: 67-104 Medications reviewed and include Nimbex, Solucortef, Giapreza, Levophed, vasopressin, flagyl.    Patient is in police custody. Law enforcement is attempting to find family.   NUTRITION - FOCUSED PHYSICAL EXAM:    Most Recent Value  Orbital Region  Mild depletion  Upper Arm Region  Unable to assess  Thoracic and Lumbar Region  Unable to assess  Buccal Region  No depletion  Temple Region  Mild depletion  Clavicle Bone Region  No depletion  Clavicle and Acromion Bone Region  No depletion  Scapular Bone Region  Unable to assess  Dorsal Hand  Unable to assess  Patellar Region  No depletion  Anterior Thigh Region  No depletion  Posterior Calf Region  Unable to assess  Edema (RD Assessment)  Unable to assess   Hair  Reviewed  Eyes  Unable to assess  Mouth  Unable to assess  Skin  Reviewed  Nails  Unable to assess       Diet Order:   Diet Order            Diet NPO time specified  Diet effective now              EDUCATION NEEDS:   No education needs have been identified at this time  Skin:  Skin Assessment: Reviewed RN Assessment  Last BM:  PTA  Height:   Ht Readings from Last 1 Encounters:  04/06/2018 '5\' 2"'  (1.575 m)    Weight:   Wt Readings from Last 1 Encounters:  04/16/18 58.6 kg    Ideal Body Weight:  50 kg  BMI:  Body mass index is 23.63 kg/m.  Estimated Nutritional Needs:   Kcal:  0175  Protein:  85-100 gm  Fluid:  1.8 L    Molli Barrows, RD, LDN, [place] Pager [phone] After Hours Pager [phone]

## 2018-04-16 NOTE — Progress Notes (Signed)
Increased FiO2 to 90% due to low spo2 down to 80%. Pateint SpO2 now 95.

## 2018-04-16 NOTE — Procedures (Signed)
Hemodialysis Catheter Insertion Procedure Note Frances Le [id number] 01/22/1986  Procedure: Insertion of Hemodialysis Catheter Indications: CRRT  Procedure Details Consent: Unable to obtain consent because of altered level of consciousness. Time Out: Verified patient identification, verified procedure, site/side was marked, verified correct patient position, special equipment/implants available, medications/allergies/relevent history reviewed, required imaging and test results available.  Performed  Maximum sterile technique was used including antiseptics, cap, gloves, gown, hand hygiene, mask and sheet. Skin prep: Chlorhexidine; local anesthetic administered A antimicrobial bonded/coated triple lumen catheter was placed in the left internal jugular vein using the Seldinger technique.  Evaluation Blood flow good Complications: No apparent complications Patient did tolerate procedure well. Chest X-ray ordered to verify placement.  CXR: pending.  Procedure performed under direct ultrasound guidance for real time vessel cannulation.      Frances Le, [place]PA - [facility] [place] [facility] Pgr: [phone]  or [phone] 04/16/2018, 5:45 PM

## 2018-04-16 NOTE — Progress Notes (Signed)
CRITICAL VALUE ALERT  Critical Value:  Lactic acid= 7.3  Date & Time Notied:  04/16/18 1441  Provider Notified: Kendrick FriesMcQuaid  Orders Received/Actions taken: See new orders

## 2018-04-16 NOTE — Progress Notes (Signed)
NAME:  Frances Le, MRN:  [id number], DOB:  26-Jan-1986, LOS: 2 ADMISSION DATE:  05/30/17, CONSULTATION DATE:  05/30/17 REFERRING MD:  [facility], CHIEF COMPLAINT:  Transfer for management of sepsis   Brief History   32 y/o female transferred for severe multi-organ failure in the setting of septic shock from staph bacteremia.   Past Medical History  Polysubstance abuse, BIPOLAR history  Significant Hospital Events     Consults:  ID Hematology  Procedures:  12/28 CT head > NACIP 12/29 CT chest/ab/pelvis> scattered airspace disease, R>L, likely diffuse septic emboli, underlying interstitial prominence, likely pulm edema, colonic wall thickening, mild diffuse mesenteric edema, haziness left renal pelvis/peri-nephric stranding, small ascites in pelvis  Significant Diagnostic Tests:    Micro Data:  Blood cx 2018-01-21 x2/4 GPC  Urine cx 2018-01-21 GPC/GNR Blood 12/30 x2 >  Resp viral panel 12/30 >   Antimicrobials:  Vanc 2018-01-21>>> Ceftriaxone 2018-01-21>>>12/29 Azithromycine 2018-01-21>>>12/29 Zosyn 12/29>>>  Interim history/subjective:  Worsening shock overnight WBC up   Objective   Blood pressure 137/66, pulse (!) 145, temperature 99.7 F (37.6 C), resp. rate (!) 23, height 5\' 2"  (1.575 m), weight 58.6 kg, SpO2 91 %, unknown if currently breastfeeding. CVP:  [4 mmHg-6 mmHg] 6 mmHg  Vent Mode: PRVC FiO2 (%):  [40 %-80 %] 80 % Set Rate:  [18 bmp] 18 bmp Vt Set:  [400 mL] 400 mL PEEP:  [5 cmH20-8 cmH20] 8 cmH20 Plateau Pressure:  [14 cmH20-24 cmH20] 24 cmH20   Intake/Output Summary (Last 24 hours) at 04/16/2018 0841 Last data filed at 04/16/2018 0800 Gross per 24 hour  Intake 7095.85 ml  Output 1554 ml  Net 5541.85 ml   Filed Weights   2018-01-21 1328 04/15/18 0500 04/16/18 0403  Weight: 49.9 kg 52 kg 58.6 kg    Examination:  General:  In bed on vent HENT: NCAT ETT in place PULM: Rhonchi bilaterally B, vent supported breathing CV: Tachy, no  clear murmur GI: BS+, soft, nontender MSK: normal bulk and tone Derm: edema hands, legs, scattered bruises, worse in toes bilaterally which are cyanotic, no mottling noted Neuro: sedated on vent    Resolved Hospital Problem list     Assessment & Plan:  MSSA Bacteremia: presumably related to IV drug use > continue cefazolin > Will ask cardiology for TEE > f/u repeat blood cultures  UTI:  > continue cefazolin, discussed with ID  Acute respiratory failure with hypoxemia: worsening > change to ARDS protocol > start nimbex for vent synchrony, plan no longer than 48 hours > VAP prevention protocol  Acute metabolic acidosis due to lactic acidosis > stop bicarbonate infusion > start lactated ringers  AKI: > monitor UOP, BMET, pH > may need HD, monitor closely  Septic shock: by exam doesn't look volume deplete, will check CVP > start CVP monitoring > wean off angiotensin, levophed, vasopressin > continue hydrocortisone  DIC, not TTP > monitor for bleeding > replace platelets if < 50K and bleeding  Best practice:  Diet: tube feeding Pain/Anxiety/Delirium protocol (if indicated): yes, RASS goal -5 with Nimbex VAP protocol (if indicated): yes DVT prophylaxis: SCD GI prophylaxis: Protonix, change to via tube Glucose control: monitor closely Mobility: bed rest  Code Status: full Family Communication: none bedside, discussed with sherrif deputy Disposition:   Labs   CBC: Recent Labs  Lab 2018-01-21 1425 2018-01-21 2333 04/15/18 0820 04/15/18 1311 04/16/18 0050 04/16/18 0342  WBC 36.4* 35.0* 28.2*  --  33.3* 58.8*  NEUTROABS 32.3* 30.1*  --   --   --   --  HGB 13.0 10.4* 10.2*  --  9.2* 8.1*  HCT 39.8 31.2* 31.4*  --  28.5* 25.9*  MCV 85.8 85.7 86.7  --  90.8 92.2  PLT 8* 7* 21* 12* 7* 56*    Basic Metabolic Panel: Recent Labs  Lab 04/07/2018 1425 03/31/2018 2333 04/15/18 0820 04/15/18 1311 04/15/18 2222 04/16/18 0050 04/16/18 0342  NA 133* 139 144  --   --   141 141  K 3.2* 3.9 3.2*  --   --  4.3 3.7  CL 98 109 114*  --   --  114* 113*  CO2 19* 16* 17*  --   --  16* 15*  GLUCOSE 126* 85 106*  --   --  93 70  BUN 116* 97* 80*  --   --  76* 77*  CREATININE 2.42* 1.91* 1.62*  --   --  1.60* 1.92*  CALCIUM 7.7* 6.9* 7.3*  --   --  7.4* 7.4*  MG  --  2.5*  --  2.6* 2.5* 2.5*  --   PHOS  --  4.1  --  4.8* 4.8* 5.4*  --    GFR: Estimated Creatinine Clearance: 33.9 mL/min (A) (by C-G formula based on SCr of 1.92 mg/dL (H)). Recent Labs  Lab 03/21/2018 1425 03/28/2018 1430 04/13/2018 1631 03/27/2018 2333 04/15/18 0250 04/15/18 0820 04/16/18 0050 04/16/18 0342  PROCALCITON 16.86  --   --  9.42  --   --   --   --   WBC 36.4*  --   --  35.0*  --  28.2* 33.3* 58.8*  LATICACIDVEN  --  5.12* 4.10* 3.9* 3.8*  --   --   --     Liver Function Tests: Recent Labs  Lab 03/22/2018 1425 03/23/2018 2333 04/16/18 0342  AST 53* 52* 68*  ALT 35 29 23  ALKPHOS 139* 129* 247*  BILITOT 6.8* 5.7* 7.6*  PROT 5.8* 4.6* 4.6*  ALBUMIN 2.1* 1.5* 1.4*   Recent Labs  Lab 03/23/2018 2333  LIPASE 17  AMYLASE 8*   Recent Labs  Lab 04/12/2018 1425  AMMONIA 34    ABG    Component Value Date/Time   PHART 7.216 (L) 04/16/2018 0356   PCO2ART 37.6 04/16/2018 0356   PO2ART 73.0 (L) 04/16/2018 0356   HCO3 15.2 (L) 04/16/2018 0356   TCO2 16 (L) 04/16/2018 0356   ACIDBASEDEF 12.0 (H) 04/16/2018 0356   O2SAT 91.0 04/16/2018 0356     Coagulation Profile: Recent Labs  Lab 04/05/2018 1425 04/05/2018 2333 04/15/18 1311  INR 1.30 1.45 1.61    Cardiac Enzymes: Recent Labs  Lab 04/10/2018 2333 04/15/18 0250 04/15/18 0820  TROPONINI 0.15* 0.14* 0.12*    HbA1C: No results found for: HGBA1C  CBG: Recent Labs  Lab 04/15/18 1536 04/15/18 2000 04/15/18 2331 04/16/18 0344 04/16/18 0415  GLUCAP 110* 105* 97 67* 104*     Critical care time: 45 minutes    [name], MD [place] [facility] Pager: [phone] Cell: [phone] If no response, call  [phone]

## 2018-04-16 NOTE — Progress Notes (Signed)
CRITICAL VALUE ALERT  Critical Value:  plt 7  Date & Time Notied:  04/16/18 120  Provider Notified: Pola CornElink  Orders Received/Actions taken: see orders

## 2018-04-16 NOTE — Progress Notes (Signed)
Increased PEEP to 12 per ARDS protocol.

## 2018-04-16 NOTE — Procedures (Signed)
Called nurse before attempting echo again. Heart rate at 127 at 1:07 PM. Will attempt again later.

## 2018-04-16 NOTE — Progress Notes (Signed)
eLink Physician-Brief Progress Note Patient Name: Normajean GlasgowBertha M XXXSutton DOB: 24-Dec-1985 MRN: [id number]   Date of Service  04/16/2018  HPI/Events of Note  MAP now maintained at >65 without having to start epinephrine or dopamine. WBC now 58. Initial blood cultures 12/28 MSSA on Cefazolin. Repeat blood cultures pending.  eICU Interventions  Ordered to repeat blood cultures. Abdominal CT showed bowel thickening, will add metronidazole for anaerobic coverage for now.  Will appreciate IDS assistance in am.     Intervention Category Major Interventions: Acid-Base disturbance - evaluation and management  Darl Pikesmily T Azayla Polo 04/16/2018, 4:35 AM

## 2018-04-16 NOTE — Progress Notes (Signed)
CRITICAL VALUE ALERT  Critical Value:  WBC 58.8  Date & Time Notied:  04/16/18 410  Provider Notified: Pola CornELINK  Orders Received/Actions taken: see orders

## 2018-04-16 NOTE — Progress Notes (Addendum)
eLink Physician-Brief Progress Note Patient Name: Frances GlasgowBertha M XXXSutton DOB: 1985/07/07 MRN: [id number]   Date of Service  04/16/2018  HPI/Events of Note  Notified of maximum pressor requirement, norepinephrine, vasopressin, Angiotensin II. Patient receiving about 200 cc of fluids/hr  eICU Interventions   Ordered stress dose steroids.  Repeat CBC and CMP ordered  Will order a dose of albumin  Bedside MD notified     Intervention Category Major Interventions: Hypotension - evaluation and management;Infection - evaluation and management Minor Interventions: Agitation / anxiety - evaluation and management  Darl Pikesmily T Siani Utke 04/16/2018, 3:36 AM

## 2018-04-17 ENCOUNTER — Inpatient Hospital Stay (HOSPITAL_COMMUNITY): Payer: Medicaid Other

## 2018-04-17 ENCOUNTER — Inpatient Hospital Stay: Payer: Self-pay

## 2018-04-17 DIAGNOSIS — N390 Urinary tract infection, site not specified: Secondary | ICD-10-CM

## 2018-04-17 DIAGNOSIS — I998 Other disorder of circulatory system: Secondary | ICD-10-CM

## 2018-04-17 DIAGNOSIS — D6959 Other secondary thrombocytopenia: Secondary | ICD-10-CM

## 2018-04-17 DIAGNOSIS — I361 Nonrheumatic tricuspid (valve) insufficiency: Secondary | ICD-10-CM

## 2018-04-17 DIAGNOSIS — R011 Cardiac murmur, unspecified: Secondary | ICD-10-CM

## 2018-04-17 DIAGNOSIS — B9620 Unspecified Escherichia coli [E. coli] as the cause of diseases classified elsewhere: Secondary | ICD-10-CM

## 2018-04-17 DIAGNOSIS — I70245 Atherosclerosis of native arteries of left leg with ulceration of other part of foot: Secondary | ICD-10-CM

## 2018-04-17 LAB — CBC WITH DIFFERENTIAL/PLATELET
Abs Immature Granulocytes: 4.59 10*3/uL — ABNORMAL HIGH (ref 0.00–0.07)
Abs Immature Granulocytes: 5.59 10*3/uL — ABNORMAL HIGH (ref 0.00–0.07)
BASOS PCT: 0 %
BASOS PCT: 0 %
Basophils Absolute: 0.2 10*3/uL — ABNORMAL HIGH (ref 0.0–0.1)
Basophils Absolute: 0.1 10*3/uL (ref 0.0–0.1)
EOS ABS: 0 10*3/uL (ref 0.0–0.5)
Eosinophils Absolute: 0 10*3/uL (ref 0.0–0.5)
Eosinophils Relative: 0 %
Eosinophils Relative: 0 %
HCT: 22.6 % — ABNORMAL LOW (ref 36.0–46.0)
HCT: 26.3 % — ABNORMAL LOW (ref 36.0–46.0)
Hemoglobin: 6.8 g/dL — CL (ref 12.0–15.0)
Hemoglobin: 8.4 g/dL — ABNORMAL LOW (ref 12.0–15.0)
IMMATURE GRANULOCYTES: 9 %
Immature Granulocytes: 7 %
Lymphocytes Relative: 8 %
Lymphocytes Relative: 10 %
Lymphs Abs: 5.3 10*3/uL — ABNORMAL HIGH (ref 0.7–4.0)
Lymphs Abs: 6.1 10*3/uL — ABNORMAL HIGH (ref 0.7–4.0)
MCH: 28.6 pg (ref 26.0–34.0)
MCH: 30.4 pg (ref 26.0–34.0)
MCHC: 30.1 g/dL (ref 30.0–36.0)
MCHC: 31.9 g/dL (ref 30.0–36.0)
MCV: 95 fL (ref 80.0–100.0)
MCV: 95.3 fL (ref 80.0–100.0)
MONOS PCT: 9 %
Monocytes Absolute: 5.8 10*3/uL — ABNORMAL HIGH (ref 0.1–1.0)
Monocytes Absolute: 2.8 10*3/uL — ABNORMAL HIGH (ref 0.1–1.0)
Monocytes Relative: 5 %
NEUTROS ABS: 48.8 10*3/uL — AB (ref 1.7–7.7)
NEUTROS PCT: 76 %
NEUTROS PCT: 76 %
Neutro Abs: 50.8 10*3/uL — ABNORMAL HIGH (ref 1.7–7.7)
Platelets: 10 10*3/uL — CL (ref 150–400)
Platelets: 8 10*3/uL — CL (ref 150–400)
RBC: 2.38 MIL/uL — ABNORMAL LOW (ref 3.87–5.11)
RBC: 2.76 MIL/uL — ABNORMAL LOW (ref 3.87–5.11)
RDW: 16.6 % — ABNORMAL HIGH (ref 11.5–15.5)
RDW: 16 % — ABNORMAL HIGH (ref 11.5–15.5)
WBC: 66.6 10*3/uL (ref 4.0–10.5)
WBC: 63.4 10*3/uL (ref 4.0–10.5)
nRBC: 2.1 % — ABNORMAL HIGH (ref 0.0–0.2)
nRBC: 4 % — ABNORMAL HIGH (ref 0.0–0.2)

## 2018-04-17 LAB — DIC (DISSEMINATED INTRAVASCULAR COAGULATION) PANEL: PLATELETS: 9 10*3/uL — AB (ref 150–400)

## 2018-04-17 LAB — BPAM PLATELET PHERESIS
BLOOD PRODUCT EXPIRATION DATE: 201912312359
ISSUE DATE / TIME: 201912300153
UNIT TYPE AND RH: 6200

## 2018-04-17 LAB — CULTURE, BLOOD (ROUTINE X 2)
Special Requests: ADEQUATE
Special Requests: ADEQUATE

## 2018-04-17 LAB — RENAL FUNCTION PANEL
ANION GAP: 17 — AB (ref 5–15)
Albumin: 1.8 g/dL — ABNORMAL LOW (ref 3.5–5.0)
Albumin: 1.9 g/dL — ABNORMAL LOW (ref 3.5–5.0)
Anion gap: 20 — ABNORMAL HIGH (ref 5–15)
BUN: 63 mg/dL — ABNORMAL HIGH (ref 6–20)
BUN: 40 mg/dL — ABNORMAL HIGH (ref 6–20)
CO2: 16 mmol/L — ABNORMAL LOW (ref 22–32)
CO2: 13 mmol/L — ABNORMAL LOW (ref 22–32)
Calcium: 6.9 mg/dL — ABNORMAL LOW (ref 8.9–10.3)
Calcium: 7.2 mg/dL — ABNORMAL LOW (ref 8.9–10.3)
Chloride: 106 mmol/L (ref 98–111)
Chloride: 103 mmol/L (ref 98–111)
Creatinine, Ser: 1.68 mg/dL — ABNORMAL HIGH (ref 0.44–1.00)
Creatinine, Ser: 1.39 mg/dL — ABNORMAL HIGH (ref 0.44–1.00)
GFR calc Af Amer: 47 mL/min — ABNORMAL LOW (ref >60)
GFR calc Af Amer: 59 mL/min — ABNORMAL LOW
GFR calc non Af Amer: 40 mL/min — ABNORMAL LOW (ref >60)
GFR calc non Af Amer: 51 mL/min — ABNORMAL LOW
Glucose, Bld: 130 mg/dL — ABNORMAL HIGH (ref 70–99)
Glucose, Bld: 137 mg/dL — ABNORMAL HIGH (ref 70–99)
Phosphorus: 8.4 mg/dL — ABNORMAL HIGH (ref 2.5–4.6)
Phosphorus: 7.4 mg/dL — ABNORMAL HIGH (ref 2.5–4.6)
Potassium: 5.2 mmol/L — ABNORMAL HIGH (ref 3.5–5.1)
Potassium: 5.5 mmol/L — ABNORMAL HIGH (ref 3.5–5.1)
SODIUM: 139 mmol/L (ref 135–145)
Sodium: 136 mmol/L (ref 135–145)

## 2018-04-17 LAB — PATHOLOGIST SMEAR REVIEW: Path Review: INCREASED

## 2018-04-17 LAB — POCT I-STAT 3, ART BLOOD GAS (G3+)
Acid-base deficit: 10 mmol/L — ABNORMAL HIGH (ref 0.0–2.0)
Acid-base deficit: 7 mmol/L — ABNORMAL HIGH (ref 0.0–2.0)
Bicarbonate: 16.9 mmol/L — ABNORMAL LOW (ref 20.0–28.0)
Bicarbonate: 19.1 mmol/L — ABNORMAL LOW (ref 20.0–28.0)
O2 Saturation: 100 %
O2 Saturation: 90 %
PCO2 ART: 42.8 mmHg (ref 32.0–48.0)
Patient temperature: 98
Patient temperature: 99.5
TCO2: 18 mmol/L — ABNORMAL LOW (ref 22–32)
TCO2: 20 mmol/L — ABNORMAL LOW (ref 22–32)
pCO2 arterial: 43 mmHg (ref 32.0–48.0)
pH, Arterial: 7.201 — ABNORMAL LOW (ref 7.350–7.450)
pH, Arterial: 7.26 — ABNORMAL LOW (ref 7.350–7.450)
pO2, Arterial: 233 mmHg — ABNORMAL HIGH (ref 83.0–108.0)
pO2, Arterial: 68 mmHg — ABNORMAL LOW (ref 83.0–108.0)

## 2018-04-17 LAB — DIC (DISSEMINATED INTRAVASCULAR COAGULATION)PANEL
D-Dimer, Quant: >20 ug/mL-FEU — ABNORMAL HIGH (ref 0.00–0.50)
Fibrinogen: <60 mg/dL — CL (ref 210–475)
INR: 6.26
Prothrombin Time: 54.3 seconds — ABNORMAL HIGH (ref 11.4–15.2)
Smear Review: NONE SEEN
aPTT: 68 seconds — ABNORMAL HIGH (ref 24–36)

## 2018-04-17 LAB — ECHOCARDIOGRAM COMPLETE
Height: 62 in
WEIGHTICAEL: 2148.16 [oz_av]

## 2018-04-17 LAB — HEPATITIS PANEL, ACUTE
HCV Ab: >11 s/co ratio — ABNORMAL HIGH (ref 0.0–0.9)
Hep A IgM: NEGATIVE
Hep B C IgM: NEGATIVE
Hepatitis B Surface Ag: NEGATIVE

## 2018-04-17 LAB — PREPARE PLATELET PHERESIS: Unit division: 0

## 2018-04-17 LAB — GLUCOSE, CAPILLARY
Glucose-Capillary: 113 mg/dL — ABNORMAL HIGH (ref 70–99)
Glucose-Capillary: 67 mg/dL — ABNORMAL LOW (ref 70–99)
Glucose-Capillary: 99 mg/dL (ref 70–99)
Glucose-Capillary: 101 mg/dL — ABNORMAL HIGH (ref 70–99)
Glucose-Capillary: 130 mg/dL — ABNORMAL HIGH (ref 70–99)
Glucose-Capillary: 157 mg/dL — ABNORMAL HIGH (ref 70–99)

## 2018-04-17 LAB — ADAMTS13 ACTIVITY REFLEX

## 2018-04-17 LAB — MAGNESIUM: Magnesium: 2.6 mg/dL — ABNORMAL HIGH (ref 1.7–2.4)

## 2018-04-17 LAB — ADAMTS13 ACTIVITY: Adamts 13 Activity: 38.9 % — ABNORMAL LOW (ref >66.8)

## 2018-04-17 LAB — PREPARE RBC (CROSSMATCH)

## 2018-04-17 LAB — LACTIC ACID, PLASMA: Lactic Acid, Venous: 11.3 mmol/L (ref 0.5–1.9)

## 2018-04-17 LAB — HIV ANTIBODY (ROUTINE TESTING W REFLEX): HIV Screen 4th Generation wRfx: NONREACTIVE

## 2018-04-17 MED ORDER — SODIUM CHLORIDE 0.9% IV SOLUTION
Freq: Once | INTRAVENOUS | Status: AC
  Administered 2018-04-17: 14:00:00 via INTRAVENOUS

## 2018-04-17 MED ORDER — HEPARIN SODIUM (PORCINE) 1000 UNIT/ML DIALYSIS
1000.0000 [IU] | INTRAMUSCULAR | Status: DC | PRN
  Filled 2018-04-17 (×2): qty 6
  Filled 2018-04-17: qty 3

## 2018-04-17 MED ORDER — SODIUM CHLORIDE 0.9% FLUSH: 10.0000 mL | INTRAVENOUS | Status: DC | PRN

## 2018-04-17 MED ORDER — SODIUM CHLORIDE 0.9% IV SOLUTION
Freq: Once | INTRAVENOUS | Status: AC
  Administered 2018-04-17: 06:00:00 via INTRAVENOUS

## 2018-04-17 MED ORDER — DEXTROSE 50 % IV SOLN
INTRAVENOUS | Status: AC
  Administered 2018-04-17: 25 mL
  Filled 2018-04-17: qty 50

## 2018-04-17 MED ORDER — SODIUM CHLORIDE 0.9% FLUSH
10.0000 mL | Freq: Two times a day (BID) | INTRAVENOUS | Status: DC
  Administered 2018-04-17 – 2018-04-19 (×2): 10 mL

## 2018-04-17 MED ORDER — CHLORHEXIDINE GLUCONATE CLOTH 2 % EX PADS
6.0000 | MEDICATED_PAD | Freq: Every day | CUTANEOUS | Status: DC
  Administered 2018-04-17 – 2018-04-19 (×4): 6 via TOPICAL

## 2018-04-17 MED ORDER — FENTANYL 2500MCG IN NS 250ML (10MCG/ML) PREMIX INFUSION: 100.0000 ug/h | INTRAVENOUS | Status: DC

## 2018-04-17 MED ORDER — CALCIUM GLUCONATE-NACL 1-0.675 GM/50ML-% IV SOLN
1.0000 g | Freq: Once | INTRAVENOUS | Status: AC
  Administered 2018-04-17: 1000 mg via INTRAVENOUS
  Filled 2018-04-17: qty 50

## 2018-04-17 MED ORDER — MIDAZOLAM 50MG/50ML (1MG/ML) PREMIX INFUSION
2.0000 mg/h | INTRAVENOUS | Status: DC
  Administered 2018-04-17 – 2018-04-18 (×2): 2 mg/h via INTRAVENOUS
  Filled 2018-04-17 (×2): qty 50

## 2018-04-17 MED ORDER — SODIUM CHLORIDE 0.9 % IV SOLN
INTRAVENOUS | Status: DC | PRN
  Administered 2018-04-17 – 2018-04-18 (×2): via INTRAVENOUS

## 2018-04-17 NOTE — Progress Notes (Signed)
eLink Physician-Brief Progress Note Patient Name: Frances LikesBertha M Le DOB: 06/22/1985 MRN: [id number]   Date of Service  04/17/2018  HPI/Events of Note  Called by radiologist with findings from Head CT Scan:  1. Small volume acute subarachnoid hemorrhage within the medial right parieto-occipital sulci greater than superior left parietal sulci. No mass effect or midline shift. No hydrocephalus. 2. Partial bilateral mastoid effusions. Platelet count = 8 this AM.   eICU Interventions  Will order: 1. CBC with platelet count STAT. 2. PT/INR and PTT STAT. 3. Will consult neurology. I have personally spoken with Dr. Otelia LimesLindzen.      Intervention Category Major Interventions: Other:  Lenell AntuSommer,Steven Eugene 04/17/2018, 11:08 PM

## 2018-04-17 NOTE — Progress Notes (Signed)
eLink Physician-Brief Progress Note Patient Name: Frances LikesBertha M Le DOB: 1986-01-24 MRN: [id number]   Date of Service  04/17/2018  HPI/Events of Note  New finding of anasacoria. L pupil = 5 mm and R pupil = 3 mm. History of endocarditis.   eICU Interventions  Will order: 1. Non contrasted Head CT Scan STAT.      Intervention Category Major Interventions: Other:;Change in mental status - evaluation and management  Sommer,Steven Dennard Nipugene 04/17/2018, 9:23 PM

## 2018-04-17 NOTE — Progress Notes (Signed)
NAME:  Frances Le, MRN:  [id number], DOB:  1985/07/17, LOS: 3 ADMISSION DATE:  2018/04/15, CONSULTATION DATE:  2018-04-15 REFERRING MD:  [facility], CHIEF COMPLAINT:  Transfer for management of sepsis   Brief History   32 y/o female transferred for severe multi-organ failure in the setting of septic shock from staph bacteremia.   Past Medical History  Polysubstance abuse, BIPOLAR history  Significant Hospital Events     Consults:  ID Hematology  Procedures:  12/28 CT head > NACIP 12/29 CT chest/ab/pelvis> scattered airspace disease, R>L, likely diffuse septic emboli, underlying interstitial prominence, likely pulm edema, colonic wall thickening, mild diffuse mesenteric edema, haziness left renal pelvis/peri-nephric stranding, small ascites in pelvis  Significant Diagnostic Tests:    Micro Data:  Blood cx 15-Apr-2018 x2/4 GPC  Urine cx 04/15/18 GPC/GNR Blood 12/30 x2 >  Resp viral panel 12/30 >   Antimicrobials:  Vanc 04/15/2018>>> Ceftriaxone 04-15-2018>>>12/29 Azithromycine 2018-04-15>>>12/29 Zosyn 12/29>>>  Interim history/subjective:  Remains on high dose vasopressors Critically ill No pulses palpable in feet bilaterally  Objective   Blood pressure 107/63, pulse (!) 114, temperature (!) 96.6 F (35.9 C), resp. rate (!) 30, height 5\' 2"  (1.575 m), weight 60.9 kg, SpO2 100 %, unknown if currently breastfeeding. CVP:  [8 mmHg-12 mmHg] 11 mmHg  Vent Mode: PRVC FiO2 (%):  [60 %-80 %] 60 % Set Rate:  [18 bmp-30 bmp] 30 bmp Vt Set:  [400 mL] 400 mL PEEP:  [8 cmH20-12 cmH20] 12 cmH20 Plateau Pressure:  [15 cmH20-26 cmH20] 21 cmH20   Intake/Output Summary (Last 24 hours) at 04/17/2018 1039 Last data filed at 04/17/2018 1000 Gross per 24 hour  Intake 5161.62 ml  Output 2524 ml  Net 2637.62 ml   Filed Weights   04/15/18 0500 04/16/18 0403 04/17/18 0500  Weight: 52 kg 58.6 kg 60.9 kg    Examination:  General:  In bed on vent HENT: NCAT ETT in  place PULM: Rhonchi bilaterally B, vent supported breathing CV: RRR, no mgr GI: BS+, soft, nontender Derm: cyanotic toes bilaterally Neuro: sedated on vent    Resolved Hospital Problem list     Assessment & Plan:  MSSA Bacteremia: presumably related to IV drug use E Coli UTI Colonic inflammation on CT > continue cefazolin > f/u repeat blood cultures > maintain flagyl > f/u TTE > f/u repeat blood cultures  Need for IV access > order PICC  Acute respiratory failure with hypoxemia: improving > wean PEEP/FiO2 for goal PaO2 55-65, SaO2 > 90%> have asked RT to wean now > likely stop nimbex today if tolerates weaning  > VAP prevention protocol  Acute metabolic acidosis due to lactic acidosis Anasarca AKI > HD per renal > if hemodynamics change start volume removal > hold LR  Septic shock: volume overloaded now > wean off giaprezza first > continue, wean levophed for MAP > 65 > vasopressin : continue for now > continue hydrocortisone  DIC > monitor for bleeding > transfuse platelets if bleeding or for PICC  Best practice:  Diet: tube feeding Pain/Anxiety/Delirium protocol (if indicated): yes, RASS goal -5 with Nimbex VAP protocol (if indicated): yes DVT prophylaxis: SCD GI prophylaxis: Protonix, change to via tube Glucose control: monitor closely Mobility: bed rest  Code Status: full Family Communication: updated aunt Verdon Cummins bedside on 12/31, informed her that her prognosis is poor Disposition: remain in ICU  Labs   CBC: Recent Labs  Lab 2018-04-15 1425 04-15-2018 2333 04/15/18 0820 04/15/18 1311 04/16/18 0050 04/16/18 0342 04/17/18  0340  WBC 36.4* 35.0* 28.2*  --  33.3* 58.8* 66.6*  NEUTROABS 32.3* 30.1*  --   --   --   --  50.8*  HGB 13.0 10.4* 10.2*  --  9.2* 8.1* 6.8*  HCT 39.8 31.2* 31.4*  --  28.5* 25.9* 22.6*  MCV 85.8 85.7 86.7  --  90.8 92.2 95.0  PLT 8* 7* 21* 12* 7* 56* 10*    Basic Metabolic Panel: Recent Labs  Lab 04/15/18 0820  04/15/18 1311 04/15/18 2222 04/16/18 0050 04/16/18 0342 04/16/18 1502 04/16/18 1600 04/17/18 0340  NA 144  --   --  141 141 142  --  139  K 3.2*  --   --  4.3 3.7 5.5*  --  5.2*  CL 114*  --   --  114* 113* 109  --  106  CO2 17*  --   --  16* 15* 18*  --  16*  GLUCOSE 106*  --   --  93 70 101*  --  130*  BUN 80*  --   --  76* 77* 80*  --  63*  CREATININE 1.62*  --   --  1.60* 1.92* 1.89*  --  1.68*  CALCIUM 7.3*  --   --  7.4* 7.4* 7.3*  --  6.9*  MG  --  2.6* 2.5* 2.5*  --   --  2.6* 2.6*  PHOS  --  4.8* 4.8* 5.4*  --   --  10.6* 8.4*   GFR: Estimated Creatinine Clearance: 42.1 mL/min (A) (by C-G formula based on SCr of 1.68 mg/dL (H)). Recent Labs  Lab 03/26/2018 1425  03/18/2018 1631 03/23/2018 2333 04/15/18 0250 04/15/18 0820 04/16/18 0050 04/16/18 0342 04/16/18 1351 04/17/18 0340  PROCALCITON 16.86  --   --  9.42  --   --   --   --   --   --   WBC 36.4*  --   --  35.0*  --  28.2* 33.3* 58.8*  --  66.6*  LATICACIDVEN  --    < > 4.10* 3.9* 3.8*  --   --   --  7.3*  --    < > = values in this interval not displayed.    Liver Function Tests: Recent Labs  Lab 04/06/2018 1425 04/13/2018 2333 04/16/18 0342 04/17/18 0340  AST 53* 52* 68*  --   ALT 35 29 23  --   ALKPHOS 139* 129* 247*  --   BILITOT 6.8* 5.7* 7.6*  --   PROT 5.8* 4.6* 4.6*  --   ALBUMIN 2.1* 1.5* 1.4* 1.8*   Recent Labs  Lab 04/09/2018 2333  LIPASE 17  AMYLASE 8*   Recent Labs  Lab 04/13/2018 1425  AMMONIA 34    ABG    Component Value Date/Time   PHART 7.201 (L) 04/17/2018 0346   PCO2ART 43.0 04/17/2018 0346   PO2ART 233.0 (H) 04/17/2018 0346   HCO3 16.9 (L) 04/17/2018 0346   TCO2 18 (L) 04/17/2018 0346   ACIDBASEDEF 10.0 (H) 04/17/2018 0346   O2SAT 100.0 04/17/2018 0346     Coagulation Profile: Recent Labs  Lab 04/17/2018 1425 03/18/2018 2333 04/15/18 1311  INR 1.30 1.45 1.61    Cardiac Enzymes: Recent Labs  Lab 04/10/2018 2333 04/15/18 0250 04/15/18 0820  TROPONINI 0.15* 0.14*  0.12*    HbA1C: No results found for: HGBA1C  CBG: Recent Labs  Lab 04/16/18 1942 04/16/18 2304 04/17/18 0347 04/17/18 0722 04/17/18 0823  GLUCAP 95  109* 113* 67* 99     Critical care time: 40 minutes    Heber CarolinaBrent Blakelyn Dinges, MD [place] [facility] Pager: [phone] Cell: [phone] If no response, call [phone]

## 2018-04-17 NOTE — Progress Notes (Signed)
ABI's have been completed. Preliminary results can be found in CV Proc through chart review.   04/17/18 9:54 AM Frances CordialGreg Oriya Kettering RVT

## 2018-04-17 NOTE — Progress Notes (Signed)
[facility] Progress Note  Name: Frances Le MRN: [id number] DOB: April 13, 1986  Chief Complaint:  AMS  Subjective:  Pt was initiated on CRRT for acidosis and worsening AKI.  Ordered for 0 ml/hr of fluid removal - she has been kept even overnight in error (for a total UF of 1.6 liters removed per updated charting).  She remains on levo, vasopressin, and Giapreza.  She had 500 mL UOP over 12/30.  Noted interim worsening of thrombocytopenia.  FiO2 and PEEP improving.  Review of systems:  Unable to obtain 2/2 mech ventilation, sedated --------------- Background:  Frances Le is a 32 y.o. female with a history of IV drug use and bipolar disorder who presented to the hospital from jail with AMS.  The patient was found to have MSSA bacteremia and has been in septic shock and hypotensive requiring 3 pressors.  Dopamine stopped this AM due to tachycardia.  Of note, the patient also has been worked up for thrombocytopenia concerning for DIC or possible TTP.  TTP has been felt less likely per team.  Work-up notable for hepatitis C ab positive.  HIV non-reactive.  Urine pregnancy negative.  UA negative for protein and 0-5 RBC.  Consents have been obtained on emergent basis.  She has been incarcerated.      Intake/Output Summary (Last 24 hours) at 04/17/2018 0636 Last data filed at 04/17/2018 0600 Gross per 24 hour  Intake 5133.42 ml  Output 2079 ml  Net 3054.42 ml    Vitals:  Vitals:   04/17/18 0500 04/17/18 0515 04/17/18 0545 04/17/18 0600  BP: (!) 106/59   (!) 103/53  Pulse: (!) 111 (!) 112 (!) 114 (!) 114  Resp: (!) 30 (!) 30 (!) 30 (!) 30  Temp: (!) 96.3 F (35.7 C) (!) 96.4 F (35.8 C) (!) 97 F (36.1 C) (!) 97.2 F (36.2 C)  TempSrc:  Bladder Bladder   SpO2: 100% 100% 100% 100%  Weight: 60.9 kg     Height:         Physical Exam:  General adult female in bed critically ill and intubated HEENT normocephalic atraumatic; ET tube in place with improved oral  mucosa bleeding Lungs coarse mechanical breath sounds Heart tachycardia  Abdomen soft nondistended; active bowel sounds Extremities 1+ edema  Neuro - sedated and paralyzed   Medications reviewed   Labs:  BMP Latest Ref Rng & Units 04/17/2018 04/16/2018 04/16/2018  Glucose 70 - 99 mg/dL 409(W130(H) 119(J101(H) 70  BUN 6 - 20 mg/dL 47(W63(H) 29(F80(H) 62(Z77(H)  Creatinine 0.44 - 1.00 mg/dL 3.08(M1.68(H) 5.78(I1.89(H) 6.96(E1.92(H)  Sodium 135 - 145 mmol/L 139 142 141  Potassium 3.5 - 5.1 mmol/L 5.2(H) 5.5(H) 3.7  Chloride 98 - 111 mmol/L 106 109 113(H)  CO2 22 - 32 mmol/L 16(L) 18(L) 15(L)  Calcium 8.9 - 10.3 mg/dL 6.9(L) 7.3(L) 7.4(L)     Assessment/Plan:   # AKI  - Secondary to ischemic ATN as well as possible emboli in setting of MSSA bacteremia  - Continue CVVHD via non-tunneled line  - Set fluid removal goal as keep even with ARDS.  Please do not hesitate to contact me with any questions regarding UF goals.  # Metabolic acidosis  - initiating CRRT and treating shock as below  # Septic shock  - on abx for bacteremia as well as norepi, vasopressin, and giapreza. Also possible concern for pyelo per imaging - Fluid removal as above   # MSSA bacteremia  - on cefazolin. Note complicated by diffuse pulmonary  emboli   # Acute hypoxic and hypercapneic respiratory failure  - on mechanical ventilation per pulm; paralyzed and sedated  # Thrombocytopenia - Initially had improved s/p platelets  - Concern for TTP or TTP-like illness  - To clarify - hem/onc has not been consulted.  Would consult per discretion of pulm/critical care  # IVDU complicated by MSSA bacteremia as above  # Hep C ab positive - ID following   # Bipolar disorder - noted   Estanislado EmmsLori C Nancylee Gaines, MD 04/17/2018 6:36 AM

## 2018-04-17 NOTE — Progress Notes (Signed)
Peripherally Inserted Central Catheter/Midline Placement  The IV Nurse has discussed with the patient and/or persons authorized to consent for the patient, the purpose of this procedure and the potential benefits and risks involved with this procedure.  The benefits include less needle sticks, lab draws from the catheter, and the patient may be discharged home with the catheter. Risks include, but not limited to, infection, bleeding, blood clot (thrombus formation), and puncture of an artery; nerve damage and irregular heartbeat and possibility to perform a PICC exchange if needed/ordered by physician.  Alternatives to this procedure were also discussed.  Bard Power PICC patient education guide, fact sheet on infection prevention and patient information card has been provided to patient /or left at bedside.    PICC/Midline Placement Documentation  PICC Triple Lumen 04/17/18 PICC Right Basilic 36 cm 0 cm (Active)  Indication for Insertion or Continuance of Line Prolonged intravenous therapies 04/17/2018 12:41 PM  Exposed Catheter (cm) 0 cm 04/17/2018 12:41 PM  Site Assessment Clean;Dry;Intact 04/17/2018 12:41 PM  Lumen #1 Status Flushed;Blood return noted;Saline locked 04/17/2018 12:41 PM  Lumen #2 Status Flushed;Blood return noted;Saline locked 04/17/2018 12:41 PM  Lumen #3 Status Flushed;Blood return noted;Saline locked 04/17/2018 12:41 PM  Dressing Type Transparent;Securing device 04/17/2018 12:41 PM  Dressing Status Clean;Dry;Intact;Antimicrobial disc in place 04/17/2018 12:41 PM  Dressing Change Due 04/24/18 04/17/2018 12:41 PM   PICC placed at the bedside by Tonna Boehringeramara Summers RN    Frances Le, Frances Le 04/17/2018, 12:43 PM

## 2018-04-17 NOTE — Progress Notes (Signed)
CRITICAL VALUE ALERT  Critical Value:  Lactic Acid 11.3 & Fibrinogen <60  Date & Time Notied: 12/31 1400  Provider Notified: CCM MD McQuaid made aware  Orders Received/Actions taken: No new orders at this time

## 2018-04-17 NOTE — Progress Notes (Signed)
Hypoglycemic Event  CBG: 67 at 0722  Treatment: 1/2 amp 50% dextrose  Symptoms: None   Follow-up CBG: Time: 0823 CBG Result:99  Possible Reasons for Event: Pt NPO  Comments/MD notifie CCM MD made aware     Leanord HawkingSheleigh R Latice Waitman

## 2018-04-17 NOTE — Progress Notes (Signed)
Notified critical lab values, WBC 66.6, hemoglobin 6.8, and platelet 10 to MD Deterding. Awaiting orders. Will continue to monitor.

## 2018-04-17 NOTE — Progress Notes (Signed)
  Echocardiogram 2D Echocardiogram has been performed.  [name], Frances Le 04/17/2018, 10:14 AM

## 2018-04-17 NOTE — Consult Note (Signed)
NEURO HOSPITALIST CONSULT NOTE   Requestig physician: Dr. Oletta Darter  Reason for Consult: Subarachnoid hemorrhage  History obtained from:  Chart     HPI:                                                                                                                                          Frances Le is an 32 y.o. female who initialy presented to [facility] on Saturday [facility] where she had been an inmate for 8 days. 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 On arrival to [facility] she had AMS and was only responding to pain. Her right hand had pitting edema and her BP was soft at 100/55 with tachycardia of 118 per medical staff at the jail. She was only verbally responsive to pain and had dilated pupils on presentation. On the DDx at that time was "possible toxic state from heroin withdrawal". Labs there showed an electrolyte disturbance, high AST and total bilirubin. Lactate was 5.12. Her CXR had bilateral upper lobe opacities. Sepsis was also on the DDx and she was admitted to the ICU. She had severe thrombocytopenia with platelets of 8 raising cocern for TTP. She was started on ABX. Decision was made to transfer to [facility] for further management.   Here at [facility], after further workup, she has an extensive problem list, including MSSA bacteremia presumably from IVDA, UTI, ARDS, acute metabolic acidosis, AKI, septic shock. Etiology for her thrombocytopenia is now felt more likely to be DIC than TTP. She has been hypotensive requiring 3 pressors. Also has been hepatitis C positive, HIV non-reactive, urine pregancy test negative. The patient is currently on CRRT. Her most recent WBC was 66.6 and her Hgb is 6.8. Platelets are 10. Her coags have been elevated with a PT of 54.3 and INR of 6.26.   At 8:30 this evening her nurse noted unequal pupils, left at 5 mm and right at 3 mm. A STAT CT head was obtained, revealing a small volume  acute subarachnoid hemorrhage within the medial right parieto-occipital sulci greater than the superior left parietal sulci, with no mass effect, midline shift or hydrocephalus.   Past Medical History:  Diagnosis Date  . Anxiety   . Bipolar 1 disorder ([place])   . Depression   . Drug abuse ([place])   . Pregnant     Past Surgical History:  Procedure Laterality Date  . CESAREAN SECTION      No family history on file.            Social History:  reports that she has been smoking cigarettes. She has been smoking about 1.00 pack per day. She has never used smokeless tobacco. She reports current alcohol use. She  reports current drug use. Drugs: IV and Methamphetamines.  No Known Allergies  MEDICATIONS:                                                                                                                     Prior to Admission:  Medications Prior to Admission  Medication Sig Dispense Refill Last Dose  . ondansetron (ZOFRAN ODT) 4 MG disintegrating tablet Take 1 tablet (4 mg total) by mouth every 8 (eight) hours as needed for nausea or vomiting. (Patient not taking: Reported on 04/17/2018) 20 tablet 0 Completed Course at Unknown time  . QUEtiapine (SEROQUEL) 50 MG tablet Take 1 tablet (50 mg total) by mouth at bedtime. (Patient not taking: Reported on 04/17/2018) 5 tablet 0 Not Taking at Unknown time   Scheduled: . artificial tears  1 application Both Eyes E1D  . chlorhexidine gluconate (MEDLINE KIT)  15 mL Mouth Rinse BID  . Chlorhexidine Gluconate Cloth  6 each Topical Daily  . hydrocortisone sod succinate (SOLU-CORTEF) inj  50 mg Intravenous Q6H  . mouth rinse  15 mL Mouth Rinse 10 times per day  . pantoprazole sodium  40 mg Per Tube Daily  . sodium chloride flush  10-40 mL Intracatheter Q12H   Continuous: .  prismasol BGK 4/2.5 300 mL/hr at 04/17/18 1400  .  prismasol BGK 4/2.5 300 mL/hr at 04/17/18 1400  . sodium chloride 10 mL/hr at 04/17/18 2300  .  ceFAZolin (ANCEF) IV 2  g (04/17/18 2348)  . cisatracurium (NIMBEX) infusion 3 mcg/kg/min (04/17/18 2300)  . feeding supplement (VITAL AF 1.2 CAL) Stopped (04/17/18 2200)  . fentaNYL infusion INTRAVENOUS 200 mcg/hr (04/17/18 2300)  . metronidazole Stopped (04/17/18 2117)  . midazolam 2 mg/hr (04/17/18 2300)  . norepinephrine (LEVOPHED) Adult infusion 30 mcg/min (04/17/18 2300)  . prismasol BGK 4/2.5 1,800 mL/hr at 04/17/18 1936  . vasopressin (PITRESSIN) infusion - *FOR SHOCK* 0.03 Units/min (04/17/18 2300)     ROS:                                                                                                                                       Unable to obtain due to AMS.    Blood pressure 132/69, pulse (!) 115, temperature 98.8 F (37.1 C), resp. rate (!) 30, height _0  (1.575 m), weight 60.9 kg, SpO2 99 %, unknown if currently breastfeeding.   General Examination:  Physical Exam   HEENT-  Normocephalic. Scleral icterus noted. Dried blood in oral cavity.    Lungs- Intubated and ventilated.  Extremities- Edematous x 4, worse to RUE.   Neurological Examination Mental Status: Paralyzed and sedated. In this context there is no spontaneous movement or reaction to any external stimuli.  Cranial Nerves: Right pupil 4 mm and unreactive. Left pupil 3 mm and sluggishly reactive. No blink to threat Eyes are conjugate at the midline without forced deviation or nystagmus. Face flaccidly symmetric. (paralyzed and sedated).  Motor/Sensory: Flaccid tone x 4 with no movement to any stimuli (paralyzed and sedated).  Deep Tendon Reflexes: Absent throughout (paralyzed and sedated).  Plantars: Mute bilaterally Cerebellar/Gait: Unable to assess    Lab Results: Basic Metabolic Panel: Recent Labs  Lab 04/15/18 1311 04/15/18 2222 04/16/18 0050 04/16/18 0342 04/16/18 1502 04/16/18 1600 04/17/18 0340  04/17/18 1524  NA  --   --  141 141 142  --  139 136  K  --   --  4.3 3.7 5.5*  --  5.2* 5.5*  CL  --   --  114* 113* 109  --  106 103  CO2  --   --  16* 15* 18*  --  16* 13*  GLUCOSE  --   --  93 70 101*  --  130* 137*  BUN  --   --  76* 77* 80*  --  63* 40*  CREATININE  --   --  1.60* 1.92* 1.89*  --  1.68* 1.39*  CALCIUM  --   --  7.4* 7.4* 7.3*  --  6.9* 7.2*  MG 2.6* 2.5* 2.5*  --   --  2.6* 2.6*  --   PHOS 4.8* 4.8* 5.4*  --   --  10.6* 8.4* 7.4*    CBC: Recent Labs  Lab 04/02/2018 1425 04/17/2018 2333 04/15/18 0820 04/15/18 1311 04/16/18 0050 04/16/18 0342 04/17/18 0340 04/17/18 1109  WBC 36.4* 35.0* 28.2*  --  33.3* 58.8* 66.6* 63.4*  NEUTROABS 32.3* 30.1*  --   --   --   --  50.8* 48.8*  HGB 13.0 10.4* 10.2*  --  9.2* 8.1* 6.8* 8.4*  HCT 39.8 31.2* 31.4*  --  28.5* 25.9* 22.6* 26.3*  MCV 85.8 85.7 86.7  --  90.8 92.2 95.0 95.3  PLT 8* 7* 21* 12* 7* 56* 10* 9*  8*    Cardiac Enzymes: Recent Labs  Lab 03/25/2018 2333 04/15/18 0250 04/15/18 0820  TROPONINI 0.15* 0.14* 0.12*    Lipid Panel: No results for input(s): CHOL, TRIG, HDL, CHOLHDL, VLDL, LDLCALC in the last 168 hours.  Imaging: Ct Head Wo Contrast  Result Date: 04/17/2018 CLINICAL DATA:  Inpatient. Altered level of consciousness. Anisocoria. Endocarditis. EXAM: CT HEAD WITHOUT CONTRAST TECHNIQUE: Contiguous axial images were obtained from the base of the skull through the vertex without intravenous contrast. COMPARISON:  03/20/2018 head CT FINDINGS: Brain: There is a small volume of acute subarachnoid hemorrhage within the medial right parieto-occipital sulci greater than superior left parietal sulci. No evidence of parenchymal hemorrhage. No mass lesion, mass effect, or midline shift. No CT evidence of acute infarction. Cerebral volume is age appropriate. No ventriculomegaly. Vascular: No appreciable acute abnormality. Skull: No evidence of calvarial fracture. Sinuses/Orbits: The visualized paranasal sinuses  are essentially clear. Other: Partial bilateral mastoid effusions. IMPRESSION: 1. Small volume acute subarachnoid hemorrhage within the medial right parieto-occipital sulci greater than superior left parietal sulci. No mass effect or midline shift. No hydrocephalus.  2. Partial bilateral mastoid effusions. Critical Value/emergent results were called by telephone at the time of interpretation on 04/17/2018 at 11:00 pm to Dr. Boone Master , who verbally acknowledged these results. Electronically Signed   By: Ilona Sorrel M.D.   On: 04/17/2018 23:04   Dg Chest Port 1 View  Result Date: 04/17/2018 CLINICAL DATA:  ARDS. EXAM: PORTABLE CHEST 1 VIEW COMPARISON:  04/16/2018. FINDINGS: Endotracheal tube, NG tube, left IJ line stable position. Heart size normal. Bilateral pulmonary infiltrates again noted. Interim improvement from prior exam. No pleural effusion or pneumothorax. IMPRESSION: 1.  Lines and tubes in stable position. 2. Bilateral pulmonary infiltrates again noted. Interim improvement from prior exam. Electronically Signed   By: Marcello Moores  Register   On: 04/17/2018 05:34   Dg Chest Port 1 View  Result Date: 04/16/2018 CLINICAL DATA:  Central line placement EXAM: PORTABLE CHEST 1 VIEW COMPARISON:  04/16/2018 FINDINGS: Right subclavian central line, endotracheal tube and NG tube remain in place, unchanged. Interval placement of left internal jugular dialysis catheter with the tip at the cavoatrial junction. No pneumothorax. Heart is normal size. Bilateral airspace opacities could reflect edema or infection, slightly improved since prior study. IMPRESSION: Left dialysis catheter placement with the tip at the cavoatrial junction. No pneumothorax. Bilateral airspace disease slightly improved, likely improving edema or infection. Electronically Signed   By: Rolm Baptise M.D.   On: 04/16/2018 18:39   Dg Chest Port 1 View  Result Date: 04/16/2018 CLINICAL DATA:  Acute onset of hypoxia. Decreased O2 saturation.  EXAM: PORTABLE CHEST 1 VIEW COMPARISON:  Chest radiograph performed 04/15/2018 FINDINGS: The patient's endotracheal tube is seen ending 3 cm above the carina. An enteric tube is noted extending below the diaphragm. A right IJ line is noted ending about the mid SVC. Bilateral septic emboli are again noted. Underlying bibasilar airspace opacification may reflect pulmonary edema or pneumonia. Small bilateral pleural effusions are noted. No pneumothorax is seen. The cardiomediastinal silhouette is normal in size. No acute osseous abnormalities are identified. IMPRESSION: 1. Endotracheal tube seen ending 3 cm above the carina. 2. Bilateral septic emboli again noted. Underlying bibasilar airspace opacification may reflect pulmonary edema or pneumonia. Small bilateral pleural effusions seen. Electronically Signed   By: Garald Balding M.D.   On: 04/16/2018 01:15   Vas [place] Burnard Bunting With/wo Tbi  Result Date: 04/17/2018 LOWER EXTREMITY DOPPLER STUDY Indications: Dusky toes.  Performing Technologist: Carlos Levering Rvt  Examination Guidelines: A complete evaluation includes at minimum, Doppler waveform signals and systolic blood pressure reading at the level of bilateral brachial, anterior tibial, and posterior tibial arteries, when vessel segments are accessible. Bilateral testing is considered an integral part of a complete examination. Photoelectric Plethysmograph (PPG) waveforms and toe systolic pressure readings are included as required and additional duplex testing as needed. Limited examinations for reoccurring indications may be performed as noted.  ABI Findings: +---------+------------------+-----+--------+----------------------------------+ Right    Rt Pressure (mmHg)IndexWaveformComment                            +---------+------------------+-----+--------+----------------------------------+ Brachial                                Unable to obtain pressure due to  the presence of an A line          +---------+------------------+-----+--------+----------------------------------+ PTA                                     Unable to obtain a dopplerable                                             signal                             +---------+------------------+-----+--------+----------------------------------+ DP                                      Unable to obtain a dopplerable                                             signal                             +---------+------------------+-----+--------+----------------------------------+ Great Toe                               Unable to obtain a dopplerable                                             signal                             +---------+------------------+-----+--------+----------------------------------+ +---------+-----------------+-----+--------+-----------------------------------+ Left     Lt Pressure      IndexWaveformComment                                      (mmHg)                                                            +---------+-----------------+-----+--------+-----------------------------------+ Brachial 115                   biphasic                                    +---------+-----------------+-----+--------+-----------------------------------+ PTA                                    Unable to obtain a dopplerable  signal                              +---------+-----------------+-----+--------+-----------------------------------+ DP                                     Unable to obtain a dopplerable                                             signal                              +---------+-----------------+-----+--------+-----------------------------------+ Great Toe                              Unable to obtain a dopplerable                                              signal                              +---------+-----------------+-----+--------+-----------------------------------+  Summary: Right: Unable to obtain a dopplerable signal in the dorsalis pedis and posterior tibial arteries. Unable to detect great toe signal. Left: Unable to obtain a dopplerable signal in the dorsalis pedis and posterior tibial arteries. Unable to detect great toe signal.  *See table(s) above for measurements and observations.  Electronically signed by Servando Snare MD on 04/17/2018 at 5:06:08 PM.    Final    [place] Ekg Site Rite  Result Date: 04/17/2018 If Site Rite image not attached, placement could not be confirmed due to current cardiac rhythm.   Assessment: 32 year old female with DIC, low platelets, abnormal coags, hepatitis C, sepsis, opiate withdrawal. CT head obtained for anisocoria reveals small volume acute subarachnoid hemorrhages.  1. CT head: Small volume acute subarachnoid hemorrhages within the medial right parieto-occipital sulci greater than superior left parietal sulci. No mass effect or midline shift. No hydrocephalus. 2. CTA head shows no aneurysm.  3. Most likely etiology for the subarachnoid hemorrhages is her severe thrombocytopenia and coagulopathy.   Recommendations: 1. MRI brain when able.  2. Management of DIC 3. Supportive care as per ICU protocols.   45 minutes spent in the Neurological evaluation and management of this critically ill patient.   Electronically signed: Dr. Kerney Elbe 04/17/2018, 11:40 PM

## 2018-04-17 NOTE — Progress Notes (Signed)
INFECTIOUS DISEASE PROGRESS NOTE  ID: Frances Le is a 32 y.o. female with  Active Problems:   T.T.P. syndrome ([place])   Sepsis ([place])   Septic shock ([place])   AKI (acute kidney injury) ([place])   Community acquired pneumonia   Drug withdrawal ([place])   Encephalopathy   ARDS (adult respiratory distress syndrome) ([place])   History of ETT   Hypoxia   Thrombocytopenia (H[facility])  Subjective: On vent.   Abtx:  Anti-infectives (From admission, onward)   Start     Dose/Rate Route Frequency Ordered Stop   04/16/18 2200  ceFAZolin (ANCEF) IVPB 2g/100 mL premix     2 g 200 mL/hr over 30 Minutes Intravenous Every 12 hours 04/16/18 1827     04/16/18 0447  metroNIDAZOLE (FLAGYL) IVPB 500 mg     500 mg 100 mL/hr over 60 Minutes Intravenous Every 8 hours 04/16/18 0447     04/15/18 1800  azithromycin (ZITHROMAX) 500 mg in sodium chloride 0.9 % 250 mL IVPB  Status:  Discontinued     500 mg 250 mL/hr over 60 Minutes Intravenous Every 24 hours 04/15/2018 2111 04/15/18 0937   04/15/18 1200  ceFAZolin (ANCEF) IVPB 2g/100 mL premix  Status:  Discontinued     2 g 200 mL/hr over 30 Minutes Intravenous Every 8 hours 04/15/18 1106 04/16/18 1827   04/15/18 1000  cefTRIAXone (ROCEPHIN) 1 g in sodium chloride 0.9 % 100 mL IVPB  Status:  Discontinued     1 g 200 mL/hr over 30 Minutes Intravenous Every 24 hours 03/23/2018 2111 04/15/18 0937   04/15/18 1000  piperacillin-tazobactam (ZOSYN) IVPB 3.375 g  Status:  Discontinued     3.375 g 12.5 mL/hr over 240 Minutes Intravenous Every 8 hours 04/15/18 0958 04/15/18 1106   04/15/18 1000  vancomycin (VANCOCIN) IVPB 750 mg/150 ml premix  Status:  Discontinued     750 mg 150 mL/hr over 60 Minutes Intravenous Every 24 hours 04/15/18 0958 04/15/18 1106   04/15/18 0000  vancomycin (VANCOCIN) IVPB 1000 mg/200 mL premix  Status:  Discontinued     1,000 mg 200 mL/hr over 60 Minutes Intravenous Every 24 hours 04/07/2018 2111 04/03/2018 2346   04/13/2018 1815  vancomycin (VANCOCIN)  IVPB 1000 mg/200 mL premix     1,000 mg 200 mL/hr over 60 Minutes Intravenous  Once 03/23/2018 1806 04/12/2018 1927   04/15/2018 1700  cefTRIAXone (ROCEPHIN) 1 g in sodium chloride 0.9 % 100 mL IVPB     1 g 200 mL/hr over 30 Minutes Intravenous  Once 04/12/2018 1650 03/22/2018 1802   04/17/2018 1700  azithromycin (ZITHROMAX) 500 mg in sodium chloride 0.9 % 250 mL IVPB     500 mg 250 mL/hr over 60 Minutes Intravenous  Once 04/07/2018 1650 03/25/2018 1905      Medications:  Scheduled: . artificial tears  1 application Both Eyes J6R  . chlorhexidine gluconate (MEDLINE KIT)  15 mL Mouth Rinse BID  . hydrocortisone sod succinate (SOLU-CORTEF) inj  50 mg Intravenous Q6H  . mouth rinse  15 mL Mouth Rinse 10 times per day  . pantoprazole sodium  40 mg Per Tube Daily    Objective: Vital signs in last 24 hours: Temp:  [94.8 F (34.9 C)-100.8 F (38.2 C)] 96.8 F (36 C) (12/31 0945) Pulse Rate:  [102-131] 114 (12/31 0945) Resp:  [17-30] 30 (12/31 0945) BP: (103-125)/(51-65) 108/59 (12/31 0900) SpO2:  [86 %-100 %] 100 % (12/31 0945) Arterial Line BP: (82-120)/(41-62) 97/48 (12/31 0915) FiO2 (%):  [  60 %-80 %] 60 % (12/31 0822) Weight:  [60.9 kg] 60.9 kg (12/31 0500)   General appearance: no distress Neck: L HD line, R IJ line Resp: coarse sounds from vent. Cardio: tachycardia with murmur GI: normal findings: soft, non-tender and abnormal findings:  absent bowel sounds Extremities: bruising, iscemic feet.   Lab Results Recent Labs    04/16/18 0342 04/16/18 1502 04/17/18 0340  WBC 58.8*  --  66.6*  HGB 8.1*  --  6.8*  HCT 25.9*  --  22.6*  NA 141 142 139  K 3.7 5.5* 5.2*  CL 113* 109 106  CO2 15* 18* 16*  BUN 77* 80* 63*  CREATININE 1.92* 1.89* 1.68*   Liver Panel Recent Labs    04/06/2018 2333 04/16/18 0342 04/17/18 0340  PROT 4.6* 4.6*  --   ALBUMIN 1.5* 1.4* 1.8*  AST 52* 68*  --   ALT 29 23  --   ALKPHOS 129* 247*  --   BILITOT 5.7* 7.6*  --    Sedimentation Rate No  results for input(s): ESRSEDRATE in the last 72 hours. C-Reactive Protein No results for input(s): CRP in the last 72 hours.  Microbiology: Recent Results (from the past 240 hour(s))  Culture, blood (Routine x 2)     Status: Abnormal   Collection Time: 04/12/2018  2:25 PM  Result Value Ref Range Status   Specimen Description   Final    BLOOD LEFT FOREARM Performed at [facility], [address], [place], [place] [address]    Special Requests   Final    BOTTLES DRAWN AEROBIC AND ANAEROBIC Blood Culture adequate volume Performed at [facility], [address], [place], [place] [address]    Culture  Setup Time   Final    GRAM POSITIVE COCCI Gram Stain Report Called to,Read Back By and Verified With: [place] @ 0735 ON [id number] BY HEDNERSON L. CRITICAL RESULT CALLED TO, READ BACK BY AND VERIFIED WITH: M. Prentiss Bells, PHARMD AT 1055 ON 04/15/18 BY C. JESSUP, MLT. Performed at [facility], [place] [address], [place], [place] [address]    Culture STAPHYLOCOCCUS AUREUS (A)  Final   Report Status 04/17/2018 FINAL  Final   Organism ID, Bacteria STAPHYLOCOCCUS AUREUS  Final      Susceptibility   Staphylococcus aureus - MIC*    CIPROFLOXACIN <=0.5 SENSITIVE Sensitive     ERYTHROMYCIN <=0.25 SENSITIVE Sensitive     GENTAMICIN <=0.5 SENSITIVE Sensitive     OXACILLIN <=0.25 SENSITIVE Sensitive     TETRACYCLINE <=1 SENSITIVE Sensitive     VANCOMYCIN 1 SENSITIVE Sensitive     TRIMETH/SULFA <=10 SENSITIVE Sensitive     CLINDAMYCIN <=0.25 SENSITIVE Sensitive     RIFAMPIN <=0.5 SENSITIVE Sensitive     Inducible Clindamycin NEGATIVE Sensitive     * STAPHYLOCOCCUS AUREUS  Culture, blood (Routine x 2)     Status: Abnormal   Collection Time: 03/30/2018  2:25 PM  Result Value Ref Range Status   Specimen Description   Final    BLOOD LEFT FOREARM Performed at [facility], [address], [place], [place] [address]    Special Requests   Final    BOTTLES DRAWN AEROBIC AND ANAEROBIC Blood  Culture adequate volume Performed at [facility], [address], [place], [place] [address]    Culture  Setup Time   Final    GRAM POSITIVE COCCI Gram Stain Report Called to,Read Back By and Verified With: Alona Bene ANAEROBIC @ 7847 ON [id number]  HENDERSON L. CRITICAL RESULT CALLED TO, READ BACK BY AND VERIFIED WITH: M. Prentiss Bells, PHARMD AT 1055 ON 04/15/18 BY C. JESSUP, MLT.    Culture (A)  Final    STAPHYLOCOCCUS AUREUS SUSCEPTIBILITIES PERFORMED ON PREVIOUS CULTURE WITHIN THE LAST 5 DAYS. Performed at [facility], [place] [address], [place], [place] [address]    Report Status 04/17/2018 FINAL  Final  Blood Culture ID Panel (Reflexed)     Status: Abnormal   Collection Time: 04/07/2018  2:25 PM  Result Value Ref Range Status   Enterococcus species NOT DETECTED NOT DETECTED Final   Listeria monocytogenes NOT DETECTED NOT DETECTED Final   Staphylococcus species DETECTED (A) NOT DETECTED Final    Comment: CRITICAL RESULT CALLED TO, READ BACK BY AND VERIFIED WITH: M. Prentiss Bells, PHARMD AT 1055 ON 04/15/18 BY C. JESSUP, MLT.    Staphylococcus aureus (BCID) DETECTED (A) NOT DETECTED Final    Comment: Methicillin (oxacillin) susceptible Staphylococcus aureus (MSSA). Preferred therapy is anti staphylococcal beta lactam antibiotic (Cefazolin or Nafcillin), unless clinically contraindicated. CRITICAL RESULT CALLED TO, READ BACK BY AND VERIFIED WITH: M. Prentiss Bells, PHARMD AT 1055 ON 04/15/18 BY C. JESSUP, MLT.    Methicillin resistance NOT DETECTED NOT DETECTED Final   Streptococcus species NOT DETECTED NOT DETECTED Final   Streptococcus agalactiae NOT DETECTED NOT DETECTED Final   Streptococcus pneumoniae NOT DETECTED NOT DETECTED Final   Streptococcus pyogenes NOT DETECTED NOT DETECTED Final   Acinetobacter baumannii NOT DETECTED NOT DETECTED Final   Enterobacteriaceae species NOT DETECTED NOT DETECTED Final   Enterobacter cloacae complex NOT DETECTED NOT DETECTED Final   Escherichia coli NOT  DETECTED NOT DETECTED Final   Klebsiella oxytoca NOT DETECTED NOT DETECTED Final   Klebsiella pneumoniae NOT DETECTED NOT DETECTED Final   Proteus species NOT DETECTED NOT DETECTED Final   Serratia marcescens NOT DETECTED NOT DETECTED Final   Haemophilus influenzae NOT DETECTED NOT DETECTED Final   Neisseria meningitidis NOT DETECTED NOT DETECTED Final   Pseudomonas aeruginosa NOT DETECTED NOT DETECTED Final   Candida albicans NOT DETECTED NOT DETECTED Final   Candida glabrata NOT DETECTED NOT DETECTED Final   Candida krusei NOT DETECTED NOT DETECTED Final   Candida parapsilosis NOT DETECTED NOT DETECTED Final   Candida tropicalis NOT DETECTED NOT DETECTED Final    Comment: Performed at [facility], [place] [address] [place], [place] [address]  Urine culture     Status: Abnormal (Preliminary result)   Collection Time: 03/29/2018  8:54 PM  Result Value Ref Range Status   Specimen Description URINE, CATHETERIZED  Final   Special Requests NONE  Final   Culture (A)  Final    >=100,000 COLONIES/mL ESCHERICHIA COLI 70,000 COLONIES/mL STAPHYLOCOCCUS AUREUS SUSCEPTIBILITIES TO FOLLOW FOR ORGANISM 2 Performed at [facility], [place] [address] [place], [place] [address]    Report Status PENDING  Incomplete   Organism ID, Bacteria ESCHERICHIA COLI (A)  Final      Susceptibility   Escherichia coli - MIC*    AMPICILLIN >=32 RESISTANT Resistant     CEFAZOLIN 16 SENSITIVE Sensitive     CEFTRIAXONE <=1 SENSITIVE Sensitive     CIPROFLOXACIN 0.5 SENSITIVE Sensitive     GENTAMICIN <=1 SENSITIVE Sensitive     IMIPENEM <=0.25 SENSITIVE Sensitive     NITROFURANTOIN <=16 SENSITIVE Sensitive     TRIMETH/SULFA >=320 RESISTANT Resistant     AMPICILLIN/SULBACTAM >=32 RESISTANT Resistant     PIP/TAZO 64 INTERMEDIATE Intermediate     Extended  ESBL NEGATIVE Sensitive     * >=100,000 COLONIES/mL ESCHERICHIA COLI  MRSA PCR Screening     Status: None   Collection Time: 03/19/2018  8:57 PM  Result  Value Ref Range Status   MRSA by PCR NEGATIVE NEGATIVE Final    Comment:        The GeneXpert MRSA Assay (FDA approved for NASAL specimens only), is one component of a comprehensive MRSA colonization surveillance program. It is not intended to diagnose MRSA infection nor to guide or monitor treatment for MRSA infections. Performed at [facility], [place] [address] [place], [place] [address]   Respiratory Panel by PCR     Status: None   Collection Time: 04/09/2018  9:03 PM  Result Value Ref Range Status   Adenovirus NOT DETECTED NOT DETECTED Final   Coronavirus 229E NOT DETECTED NOT DETECTED Final   Coronavirus HKU1 NOT DETECTED NOT DETECTED Final   Coronavirus NL63 NOT DETECTED NOT DETECTED Final   Coronavirus OC43 NOT DETECTED NOT DETECTED Final   Metapneumovirus NOT DETECTED NOT DETECTED Final   Rhinovirus / Enterovirus NOT DETECTED NOT DETECTED Final   Influenza A NOT DETECTED NOT DETECTED Final   Influenza B NOT DETECTED NOT DETECTED Final   Parainfluenza Virus 1 NOT DETECTED NOT DETECTED Final   Parainfluenza Virus 2 NOT DETECTED NOT DETECTED Final   Parainfluenza Virus 3 NOT DETECTED NOT DETECTED Final   Parainfluenza Virus 4 NOT DETECTED NOT DETECTED Final   Respiratory Syncytial Virus NOT DETECTED NOT DETECTED Final   Bordetella pertussis NOT DETECTED NOT DETECTED Final   Chlamydophila pneumoniae NOT DETECTED NOT DETECTED Final   Mycoplasma pneumoniae NOT DETECTED NOT DETECTED Final  Culture, blood (routine x 2)     Status: None (Preliminary result)   Collection Time: 03/31/2018 11:30 PM  Result Value Ref Range Status   Specimen Description BLOOD RIGHT ARM  Final   Special Requests   Final    BOTTLES DRAWN AEROBIC ONLY Blood Culture results may not be optimal due to an inadequate volume of blood received in culture bottles   Culture   Final    NO GROWTH 2 DAYS Performed at [facility], [address], [place], [place] [address]    Report Status PENDING   Incomplete  Culture, blood (routine x 2)     Status: None (Preliminary result)   Collection Time: 04/15/2018 11:33 PM  Result Value Ref Range Status   Specimen Description BLOOD RIGHT HAND  Final   Special Requests   Final    BOTTLES DRAWN AEROBIC ONLY Blood Culture adequate volume   Culture   Final    NO GROWTH 2 DAYS Performed at [facility], [place] [address], [place], [place] [address]    Report Status PENDING  Incomplete  Culture, blood (Routine X 2) w Reflex to ID Panel     Status: None (Preliminary result)   Collection Time: 04/16/18  6:38 AM  Result Value Ref Range Status   Specimen Description BLOOD LEFT ANTECUBITAL  Final   Special Requests AEROBIC BOTTLE ONLY Blood Culture adequate volume  Final   Culture   Final    NO GROWTH < 24 HOURS Performed at [facility], [place] [address] [place], [place] [address]    Report Status PENDING  Incomplete  Culture, blood (Routine X 2) w Reflex to ID Panel     Status: None (Preliminary result)   Collection Time: 04/16/18  6:45 AM  Result Value  Ref Range Status   Specimen Description BLOOD BLOOD RIGHT HAND  Final   Special Requests AEROBIC BOTTLE ONLY Blood Culture adequate volume  Final   Culture   Final    NO GROWTH < 24 HOURS Performed at [facility], [address] [place], [place] [address]    Report Status PENDING  Incomplete    Studies/Results: Dg Chest 1 View  Addendum Date: 04/15/2018   ADDENDUM REPORT: 04/15/2018 16:12 ADDENDUM: Right IJ central line catheter terminates in the mid SVC. No pneumothorax. Electronically Signed   By: [place] Royalty M.D.   On: 04/15/2018 16:12   Result Date: 04/15/2018 CLINICAL DATA:  Right-sided central line placement and endotracheal tube assessment. Substance abuse. EXAM: CHEST  1 VIEW COMPARISON:  None. FINDINGS: The patient is tilted to the left. Endotracheal tube tip is 2.7 cm above the carina. Gastric tube extends below the left hemidiaphragm though the tip and side port  are excluded and not visualized on this study. Interval increase in pulmonary vascular congestion, left greater than right. Nodular masslike opacities project over the right mid and lower lobes suspicious for possible septic emboli given history of substance abuse. No acute osseous abnormality. IMPRESSION: 1. Endotracheal tube tip is 2.7 cm above the carina. 2. Gastric tube extends below the left hemidiaphragm though the tip and side port are excluded on this study. 3. Nodular masslike opacities project over the right mid and lower lobes, suspicious for possible septic emboli given history of substance abuse. 4. Interval increase in pulmonary vascular congestion more left-sided. Asymmetric pulmonary edema due to patient positioning might account this appearance. Electronically Signed: By: [place] Royalty M.D. On: 04/15/2018 15:18   Dg Chest Port 1 View  Result Date: 04/17/2018 CLINICAL DATA:  ARDS. EXAM: PORTABLE CHEST 1 VIEW COMPARISON:  04/16/2018. FINDINGS: Endotracheal tube, NG tube, left IJ line stable position. Heart size normal. Bilateral pulmonary infiltrates again noted. Interim improvement from prior exam. No pleural effusion or pneumothorax. IMPRESSION: 1.  Lines and tubes in stable position. 2. Bilateral pulmonary infiltrates again noted. Interim improvement from prior exam. Electronically Signed   By: Marcello Moores  Register   On: 04/17/2018 05:34   Dg Chest Port 1 View  Result Date: 04/16/2018 CLINICAL DATA:  Central line placement EXAM: PORTABLE CHEST 1 VIEW COMPARISON:  04/16/2018 FINDINGS: Right subclavian central line, endotracheal tube and NG tube remain in place, unchanged. Interval placement of left internal jugular dialysis catheter with the tip at the cavoatrial junction. No pneumothorax. Heart is normal size. Bilateral airspace opacities could reflect edema or infection, slightly improved since prior study. IMPRESSION: Left dialysis catheter placement with the tip at the cavoatrial junction.  No pneumothorax. Bilateral airspace disease slightly improved, likely improving edema or infection. Electronically Signed   By: Rolm Baptise M.D.   On: 04/16/2018 18:39   Dg Chest Port 1 View  Result Date: 04/16/2018 CLINICAL DATA:  Acute onset of hypoxia. Decreased O2 saturation. EXAM: PORTABLE CHEST 1 VIEW COMPARISON:  Chest radiograph performed 04/15/2018 FINDINGS: The patient's endotracheal tube is seen ending 3 cm above the carina. An enteric tube is noted extending below the diaphragm. A right IJ line is noted ending about the mid SVC. Bilateral septic emboli are again noted. Underlying bibasilar airspace opacification may reflect pulmonary edema or pneumonia. Small bilateral pleural effusions are noted. No pneumothorax is seen. The cardiomediastinal silhouette is normal in size. No acute osseous abnormalities are identified. IMPRESSION: 1. Endotracheal tube seen ending 3 cm above the carina. 2.  Bilateral septic emboli again noted. Underlying bibasilar airspace opacification may reflect pulmonary edema or pneumonia. Small bilateral pleural effusions seen. Electronically Signed   By: Garald Balding M.D.   On: 04/16/2018 01:15   Dg Chest Port 1 View  Result Date: 04/15/2018 CLINICAL DATA:  Droplet precautions. Hx ETT Pt was unresponsive Negative pregnancy test EXAM: PORTABLE CHEST 1 VIEW COMPARISON:  Chest x-ray 04/17/2018, CT of the chest on 04/15/2018 FINDINGS: Endotracheal tube is in place with tip approximately 2.9 centimeters above the carina. Nasogastric tube is in place, tip overlying the level of the stomach. Persistent significant lung opacities, including masslike densities, appear stable. IMPRESSION: 1. Stable appearance of the chest. 2. Endotracheal tube is in place with tip approximately 2.9 centimeters above the carina. Electronically Signed   By: Nolon Nations M.D.   On: 04/15/2018 10:51   Vas [place] Burnard Bunting With/wo Tbi  Result Date: 04/17/2018 LOWER EXTREMITY DOPPLER STUDY Indications:  Dusky toes.  Performing Technologist: Carlos Levering Rvt  Examination Guidelines: A complete evaluation includes at minimum, Doppler waveform signals and systolic blood pressure reading at the level of bilateral brachial, anterior tibial, and posterior tibial arteries, when vessel segments are accessible. Bilateral testing is considered an integral part of a complete examination. Photoelectric Plethysmograph (PPG) waveforms and toe systolic pressure readings are included as required and additional duplex testing as needed. Limited examinations for reoccurring indications may be performed as noted.  ABI Findings: +---------+------------------+-----+--------+----------------------------------+ Right    Rt Pressure (mmHg)IndexWaveformComment                            +---------+------------------+-----+--------+----------------------------------+ Brachial                                Unable to obtain pressure due to                                           the presence of an A line          +---------+------------------+-----+--------+----------------------------------+ PTA                                     Unable to obtain a dopplerable                                             signal                             +---------+------------------+-----+--------+----------------------------------+ DP                                      Unable to obtain a dopplerable                                             signal                             +---------+------------------+-----+--------+----------------------------------+  Great Toe                               Unable to obtain a dopplerable                                             signal                             +---------+------------------+-----+--------+----------------------------------+ +---------+-----------------+-----+--------+-----------------------------------+ Left     Lt Pressure       IndexWaveformComment                                      (mmHg)                                                            +---------+-----------------+-----+--------+-----------------------------------+ Brachial 115                   biphasic                                    +---------+-----------------+-----+--------+-----------------------------------+ PTA                                    Unable to obtain a dopplerable                                             signal                              +---------+-----------------+-----+--------+-----------------------------------+ DP                                     Unable to obtain a dopplerable                                             signal                              +---------+-----------------+-----+--------+-----------------------------------+ Great Toe                              Unable to obtain a dopplerable                                             signal                              +---------+-----------------+-----+--------+-----------------------------------+  Summary: Right: Unable to obtain a dopplerable signal in the dorsalis pedis and posterior tibial arteries. Unable to detect great toe signal. Left: Unable to obtain a dopplerable signal in the dorsalis pedis and posterior tibial arteries. Unable to detect great toe signal.  *See table(s) above for measurements and observations.     Preliminary    [place] Ekg Site Rite  Result Date: 04/15/2018 If Site Rite image not attached, placement could not be confirmed due to current cardiac rhythm.    Assessment/Plan: Sepsis MSSA bacteremia (12-28 [place] Pen) with septic emboli Polysubs abuse VDRF Thrombocytopenia secondary to sepsis Cecal/colonic thickening E coli UTI ARF on CRRT Protein calorie malnutrition, severe Incarcerated  Total days of antibiotics: 4 ancef, 1 flagyl  She is doing poorly. Acidotic, on CRRT,  pressors, WBC increasing, PLT count remains poor.  No change in her anbx  Discussed idea of family meeting with RN         Bobby Rumpf MD, FACP Infectious Diseases (pager) [phone] [email or web address] 04/17/2018, 10:15 AM  LOS: 3 days

## 2018-04-17 NOTE — Progress Notes (Signed)
CRITICAL VALUE ALERT  Critical Value:  PT 54.3 & INR 6.26  Date & Time Notied: 12/31 at 12:40  Provider Notified: CCM MD McQuaid notified   Orders Received/Actions taken: Awaiting new orders. Will continue to monitor patient.

## 2018-04-17 NOTE — Progress Notes (Signed)
eLink Physician-Brief Progress Note Patient Name: Frances LikesBertha M Redwine DOB: 02-21-1986 MRN: [id number]   Date of Service  04/17/2018  HPI/Events of Note  Hgb drop to 6.8 with plts of 10  eICU Interventions  Transfuse 1 unit pRBC Post-transfusion CBC Hold on plt txf for now     Intervention Category Major Interventions: Other:  DETERDING,ELIZABETH 04/17/2018, 4:52 AM

## 2018-04-18 ENCOUNTER — Inpatient Hospital Stay (HOSPITAL_COMMUNITY): Payer: Medicaid Other

## 2018-04-18 ENCOUNTER — Encounter (HOSPITAL_COMMUNITY): Payer: Self-pay | Admitting: *Deleted

## 2018-04-18 DIAGNOSIS — D689 Coagulation defect, unspecified: Secondary | ICD-10-CM

## 2018-04-18 DIAGNOSIS — K7200 Acute and subacute hepatic failure without coma: Secondary | ICD-10-CM

## 2018-04-18 DIAGNOSIS — J9601 Acute respiratory failure with hypoxia: Secondary | ICD-10-CM

## 2018-04-18 DIAGNOSIS — I609 Nontraumatic subarachnoid hemorrhage, unspecified: Secondary | ICD-10-CM

## 2018-04-18 DIAGNOSIS — I330 Acute and subacute infective endocarditis: Secondary | ICD-10-CM

## 2018-04-18 DIAGNOSIS — I6340 Cerebral infarction due to embolism of unspecified cerebral artery: Secondary | ICD-10-CM

## 2018-04-18 DIAGNOSIS — D65 Disseminated intravascular coagulation [defibrination syndrome]: Secondary | ICD-10-CM

## 2018-04-18 DIAGNOSIS — R6520 Severe sepsis without septic shock: Secondary | ICD-10-CM

## 2018-04-18 LAB — CBC WITH DIFFERENTIAL/PLATELET
Abs Immature Granulocytes: 1.7 10*3/uL — ABNORMAL HIGH (ref 0.00–0.07)
Abs Immature Granulocytes: 1.2 10*3/uL — ABNORMAL HIGH (ref 0.00–0.07)
Band Neutrophils: 2 %
Band Neutrophils: 5 %
Basophils Absolute: 0 10*3/uL (ref 0.0–0.1)
Basophils Absolute: 0 10*3/uL (ref 0.0–0.1)
Basophils Absolute: 0 10*3/uL (ref 0.0–0.1)
Basophils Relative: 0 %
Basophils Relative: 0 %
Basophils Relative: 0 %
EOS ABS: 0 10*3/uL (ref 0.0–0.5)
EOS ABS: 0 10*3/uL (ref 0.0–0.5)
EOS PCT: 0 %
Eosinophils Absolute: 0 10*3/uL (ref 0.0–0.5)
Eosinophils Relative: 0 %
Eosinophils Relative: 0 %
HCT: 21.1 % — ABNORMAL LOW (ref 36.0–46.0)
HCT: 22.8 % — ABNORMAL LOW (ref 36.0–46.0)
HCT: 19.3 % — ABNORMAL LOW (ref 36.0–46.0)
Hemoglobin: 6.6 g/dL — CL (ref 12.0–15.0)
Hemoglobin: 7.1 g/dL — ABNORMAL LOW (ref 12.0–15.0)
Hemoglobin: 6.1 g/dL — CL (ref 12.0–15.0)
Lymphocytes Relative: 5 %
Lymphocytes Relative: 3 %
Lymphocytes Relative: 6 %
Lymphs Abs: 2.4 10*3/uL (ref 0.7–4.0)
Lymphs Abs: 1.3 10*3/uL (ref 0.7–4.0)
Lymphs Abs: 2.4 10*3/uL (ref 0.7–4.0)
MCH: 29.3 pg (ref 26.0–34.0)
MCH: 27.7 pg (ref 26.0–34.0)
MCH: 27.9 pg (ref 26.0–34.0)
MCHC: 31.3 g/dL (ref 30.0–36.0)
MCHC: 31.6 g/dL (ref 30.0–36.0)
MCHC: 31.1 g/dL (ref 30.0–36.0)
MCV: 93.8 fL (ref 80.0–100.0)
MCV: 89.1 fL (ref 80.0–100.0)
MCV: 88.1 fL (ref 80.0–100.0)
METAMYELOCYTES PCT: 3 %
MONOS PCT: 1 %
Metamyelocytes Relative: 3 %
Monocytes Absolute: 3.7 10*3/uL — ABNORMAL HIGH (ref 0.1–1.0)
Monocytes Absolute: 0.4 10*3/uL (ref 0.1–1.0)
Monocytes Absolute: 0 10*3/uL — ABNORMAL LOW (ref 0.1–1.0)
Monocytes Relative: 8 %
Monocytes Relative: 0 %
Myelocytes: 1 %
NRBC: 5 /100{WBCs} — AB
NRBC: 5.2 % — AB (ref 0.0–0.2)
Neutro Abs: 43 10*3/uL — ABNORMAL HIGH (ref 1.7–7.7)
Neutro Abs: 39.7 10*3/uL — ABNORMAL HIGH (ref 1.7–7.7)
Neutro Abs: 36.5 10*3/uL — ABNORMAL HIGH (ref 1.7–7.7)
Neutrophils Relative %: 87 %
Neutrophils Relative %: 90 %
Neutrophils Relative %: 86 %
Platelets: 9 10*3/uL — CL (ref 150–400)
Platelets: 8 10*3/uL — CL (ref 150–400)
Platelets: 67 10*3/uL — ABNORMAL LOW (ref 150–400)
RBC: 2.25 MIL/uL — ABNORMAL LOW (ref 3.87–5.11)
RBC: 2.56 MIL/uL — ABNORMAL LOW (ref 3.87–5.11)
RBC: 2.19 MIL/uL — ABNORMAL LOW (ref 3.87–5.11)
RDW: 16.2 % — ABNORMAL HIGH (ref 11.5–15.5)
RDW: 16.8 % — ABNORMAL HIGH (ref 11.5–15.5)
RDW: 17.4 % — ABNORMAL HIGH (ref 11.5–15.5)
WBC: 49.4 10*3/uL — ABNORMAL HIGH (ref 4.0–10.5)
WBC: 43.2 10*3/uL — ABNORMAL HIGH (ref 4.0–10.5)
WBC: 40.1 10*3/uL — ABNORMAL HIGH (ref 4.0–10.5)
nRBC: 6.7 % — ABNORMAL HIGH (ref 0.0–0.2)
nRBC: 6.2 % — ABNORMAL HIGH (ref 0.0–0.2)
nRBC: 10 /100 WBC — ABNORMAL HIGH

## 2018-04-18 LAB — CBC
HCT: 24.4 % — ABNORMAL LOW (ref 36.0–46.0)
Hemoglobin: 7.9 g/dL — ABNORMAL LOW (ref 12.0–15.0)
MCH: 28.4 pg (ref 26.0–34.0)
MCHC: 32.4 g/dL (ref 30.0–36.0)
MCV: 87.8 fL (ref 80.0–100.0)
NRBC: 6.3 % — AB (ref 0.0–0.2)
Platelets: 44 10*3/uL — ABNORMAL LOW (ref 150–400)
RBC: 2.78 MIL/uL — ABNORMAL LOW (ref 3.87–5.11)
RDW: 16.5 % — ABNORMAL HIGH (ref 11.5–15.5)
WBC: 36.6 10*3/uL — AB (ref 4.0–10.5)

## 2018-04-18 LAB — URINE CULTURE: Culture: >=100000 — AB

## 2018-04-18 LAB — COMPREHENSIVE METABOLIC PANEL
ALT: 627 U/L — ABNORMAL HIGH (ref 0–44)
AST: 4900 U/L — AB (ref 15–41)
Albumin: 2.2 g/dL — ABNORMAL LOW (ref 3.5–5.0)
Alkaline Phosphatase: 500 U/L — ABNORMAL HIGH (ref 38–126)
Anion gap: 14 (ref 5–15)
BUN: 39 mg/dL — AB (ref 6–20)
CO2: 19 mmol/L — AB (ref 22–32)
Calcium: 7.6 mg/dL — ABNORMAL LOW (ref 8.9–10.3)
Chloride: 102 mmol/L (ref 98–111)
Creatinine, Ser: 1.44 mg/dL — ABNORMAL HIGH (ref 0.44–1.00)
GFR calc Af Amer: 56 mL/min — ABNORMAL LOW (ref >60)
GFR calc non Af Amer: 49 mL/min — ABNORMAL LOW (ref >60)
Glucose, Bld: 113 mg/dL — ABNORMAL HIGH (ref 70–99)
Potassium: 5.2 mmol/L — ABNORMAL HIGH (ref 3.5–5.1)
SODIUM: 135 mmol/L (ref 135–145)
Total Bilirubin: 21 mg/dL (ref 0.3–1.2)
Total Protein: 6 g/dL — ABNORMAL LOW (ref 6.5–8.1)

## 2018-04-18 LAB — GLUCOSE, CAPILLARY
GLUCOSE-CAPILLARY: 109 mg/dL — AB (ref 70–99)
GLUCOSE-CAPILLARY: 116 mg/dL — AB (ref 70–99)
Glucose-Capillary: 138 mg/dL — ABNORMAL HIGH (ref 70–99)
Glucose-Capillary: 99 mg/dL (ref 70–99)
Glucose-Capillary: 125 mg/dL — ABNORMAL HIGH (ref 70–99)
Glucose-Capillary: 105 mg/dL — ABNORMAL HIGH (ref 70–99)
Glucose-Capillary: 196 mg/dL — ABNORMAL HIGH (ref 70–99)
Glucose-Capillary: 66 mg/dL — ABNORMAL LOW (ref 70–99)

## 2018-04-18 LAB — RENAL FUNCTION PANEL
Albumin: 2.3 g/dL — ABNORMAL LOW (ref 3.5–5.0)
Anion gap: 11 (ref 5–15)
BUN: 37 mg/dL — ABNORMAL HIGH (ref 6–20)
CO2: 22 mmol/L (ref 22–32)
Calcium: 7.6 mg/dL — ABNORMAL LOW (ref 8.9–10.3)
Chloride: 104 mmol/L (ref 98–111)
Creatinine, Ser: 1.13 mg/dL — ABNORMAL HIGH (ref 0.44–1.00)
GFR calc Af Amer: >60 mL/min (ref >60)
Glucose, Bld: 143 mg/dL — ABNORMAL HIGH (ref 70–99)
Phosphorus: 6.3 mg/dL — ABNORMAL HIGH (ref 2.5–4.6)
Potassium: 5.1 mmol/L (ref 3.5–5.1)
Sodium: 137 mmol/L (ref 135–145)

## 2018-04-18 LAB — BPAM RBC
Blood Product Expiration Date: 202001072359
ISSUE DATE / TIME: 201912310533
Unit Type and Rh: 9500

## 2018-04-18 LAB — BPAM FFP
Blood Product Expiration Date: 202001052359
Blood Product Expiration Date: 202001052359
ISSUE DATE / TIME: 201912311418
ISSUE DATE / TIME: 201912311507
Unit Type and Rh: 5100
Unit Type and Rh: 9500

## 2018-04-18 LAB — PREPARE FRESH FROZEN PLASMA
Unit division: 0
Unit division: 0

## 2018-04-18 LAB — MAGNESIUM: Magnesium: 2.7 mg/dL — ABNORMAL HIGH (ref 1.7–2.4)

## 2018-04-18 LAB — AMMONIA: AMMONIA: 175 umol/L — AB (ref 9–35)

## 2018-04-18 LAB — TYPE AND SCREEN
ABO/RH(D): O NEG
Antibody Screen: NEGATIVE
Unit division: 0

## 2018-04-18 LAB — APTT: aPTT: 60 seconds — ABNORMAL HIGH (ref 24–36)

## 2018-04-18 LAB — PHOSPHORUS: Phosphorus: 6.6 mg/dL — ABNORMAL HIGH (ref 2.5–4.6)

## 2018-04-18 LAB — PROTIME-INR
INR: 4.92
Prothrombin Time: 45 seconds — ABNORMAL HIGH (ref 11.4–15.2)

## 2018-04-18 LAB — PREPARE RBC (CROSSMATCH)

## 2018-04-18 MED ORDER — FENTANYL BOLUS VIA INFUSION
50.0000 ug | INTRAVENOUS | Status: DC | PRN
  Filled 2018-04-18: qty 50

## 2018-04-18 MED ORDER — SODIUM CHLORIDE 0.9% IV SOLUTION: Freq: Once | INTRAVENOUS | Status: DC

## 2018-04-18 MED ORDER — FENTANYL CITRATE (PF) 100 MCG/2ML IJ SOLN: 50.0000 ug | Freq: Once | INTRAMUSCULAR | Status: DC

## 2018-04-18 MED ORDER — DEXTROSE 50 % IV SOLN
INTRAVENOUS | Status: AC
  Administered 2018-04-18: 50 mL
  Filled 2018-04-18: qty 50

## 2018-04-18 MED ORDER — MIDAZOLAM 50MG/50ML (1MG/ML) PREMIX INFUSION: 0.5000 mg/h | INTRAVENOUS | Status: DC

## 2018-04-18 MED ORDER — IOPAMIDOL (ISOVUE-370) INJECTION 76%
100.0000 mL | Freq: Once | INTRAVENOUS | Status: AC | PRN
  Administered 2018-04-18: 100 mL via INTRAVENOUS

## 2018-04-18 MED ORDER — DEXTROSE 10 % IV SOLN
INTRAVENOUS | Status: DC
  Administered 2018-04-18: 16:00:00 via INTRAVENOUS

## 2018-04-18 MED ORDER — MIDAZOLAM HCL 2 MG/2ML IJ SOLN: 2.0000 mg | INTRAMUSCULAR | Status: DC | PRN

## 2018-04-18 MED ORDER — SODIUM CHLORIDE 0.9% IV SOLUTION
Freq: Once | INTRAVENOUS | Status: AC
  Administered 2018-04-18: 16:00:00 via INTRAVENOUS

## 2018-04-18 MED ORDER — RHO D IMMUNE GLOBULIN 1500 UNIT/2ML IJ SOSY
300.0000 ug | PREFILLED_SYRINGE | Freq: Once | INTRAMUSCULAR | Status: DC
  Filled 2018-04-18: qty 2

## 2018-04-18 MED ORDER — FENTANYL 2500MCG IN NS 250ML (10MCG/ML) PREMIX INFUSION: 25.0000 ug/h | INTRAVENOUS | Status: DC

## 2018-04-18 NOTE — Progress Notes (Signed)
CRITICAL VALUE ALERT  Critical Value:  Hypoglycemic Event  CBG: 66  Treatment: D50 50 mL (25 gm)  Symptoms: None  Follow-up CBG: Time:1520 CBG Result:196  Possible Reasons for Event: Inadequate meal intake  Comments/MD notified:Yes    Anikin Prosser E    Date & Time Notied:  04/18/18 1505  Provider Notified: Yes  Orders Received/Actions taken: Dextrose given IV, awaiting other fluid orders

## 2018-04-18 NOTE — Progress Notes (Addendum)
INFECTIOUS DISEASE PROGRESS NOTE  ID: Frances Le is a 33 y.o. female with  Active Problems:   T.T.P. syndrome ([place])   Sepsis ([place])   Septic shock ([place])   AKI (acute kidney injury) ([place])   Community acquired pneumonia   Drug withdrawal ([place])   Encephalopathy   ARDS (adult respiratory distress syndrome) ([place])   History of ETT   Hypoxia   Thrombocytopenia ([place])  Subjective: On vent, CRRT, pressors Family present  Abtx:  Anti-infectives (From admission, onward)   Start     Dose/Rate Route Frequency Ordered Stop   04/16/18 2200  ceFAZolin (ANCEF) IVPB 2g/100 mL premix     2 g 200 mL/hr over 30 Minutes Intravenous Every 12 hours 04/16/18 1827     04/16/18 0447  metroNIDAZOLE (FLAGYL) IVPB 500 mg     500 mg 100 mL/hr over 60 Minutes Intravenous Every 8 hours 04/16/18 0447     04/15/18 1800  azithromycin (ZITHROMAX) 500 mg in sodium chloride 0.9 % 250 mL IVPB  Status:  Discontinued     500 mg 250 mL/hr over 60 Minutes Intravenous Every 24 hours 03/29/2018 2111 04/15/18 0937   04/15/18 1200  ceFAZolin (ANCEF) IVPB 2g/100 mL premix  Status:  Discontinued     2 g 200 mL/hr over 30 Minutes Intravenous Every 8 hours 04/15/18 1106 04/16/18 1827   04/15/18 1000  cefTRIAXone (ROCEPHIN) 1 g in sodium chloride 0.9 % 100 mL IVPB  Status:  Discontinued     1 g 200 mL/hr over 30 Minutes Intravenous Every 24 hours 04/07/2018 2111 04/15/18 0937   04/15/18 1000  piperacillin-tazobactam (ZOSYN) IVPB 3.375 g  Status:  Discontinued     3.375 g 12.5 mL/hr over 240 Minutes Intravenous Every 8 hours 04/15/18 0958 04/15/18 1106   04/15/18 1000  vancomycin (VANCOCIN) IVPB 750 mg/150 ml premix  Status:  Discontinued     750 mg 150 mL/hr over 60 Minutes Intravenous Every 24 hours 04/15/18 0958 04/15/18 1106   04/15/18 0000  vancomycin (VANCOCIN) IVPB 1000 mg/200 mL premix  Status:  Discontinued     1,000 mg 200 mL/hr over 60 Minutes Intravenous Every 24 hours 04/15/2018 2111 04/15/2018 2346   03/21/2018  1815  vancomycin (VANCOCIN) IVPB 1000 mg/200 mL premix     1,000 mg 200 mL/hr over 60 Minutes Intravenous  Once 04/10/2018 1806 03/19/2018 1927   04/06/2018 1700  cefTRIAXone (ROCEPHIN) 1 g in sodium chloride 0.9 % 100 mL IVPB     1 g 200 mL/hr over 30 Minutes Intravenous  Once 04/15/2018 1650 03/20/2018 1802   04/13/2018 1700  azithromycin (ZITHROMAX) 500 mg in sodium chloride 0.9 % 250 mL IVPB     500 mg 250 mL/hr over 60 Minutes Intravenous  Once 03/28/2018 1650 04/03/2018 1905      Medications:  Scheduled: . sodium chloride   Intravenous Once  . sodium chloride   Intravenous Once  . sodium chloride   Intravenous Once  . chlorhexidine gluconate (MEDLINE KIT)  15 mL Mouth Rinse BID  . Chlorhexidine Gluconate Cloth  6 each Topical Daily  . fentaNYL (SUBLIMAZE) injection  50 mcg Intravenous Once  . hydrocortisone sod succinate (SOLU-CORTEF) inj  50 mg Intravenous Q6H  . mouth rinse  15 mL Mouth Rinse 10 times per day  . pantoprazole sodium  40 mg Per Tube Daily  . rho (d) immune globulin  300 mcg Intravenous Once  . sodium chloride flush  10-40 mL Intracatheter Q12H    Objective:  Vital signs in last 24 hours: Temp:  [96.3 F (35.7 C)-99 F (37.2 C)] 96.6 F (35.9 C) (01/01 0800) Pulse Rate:  [102-123] 113 (01/01 0803) Resp:  [26-41] 30 (01/01 0803) BP: (93-132)/(46-83) 124/78 (01/01 0803) SpO2:  [94 %-100 %] 100 % (01/01 0803) Arterial Line BP: (85-148)/(43-81) 108/51 (01/01 0800) FiO2 (%):  [40 %-100 %] 100 % (01/01 0803)   General appearance: toxic Resp: rhonchi anterior - bilateral Cardio: tachycardia GI: normal findings: soft, non-tender and abnormal findings:  hypoactive bowel sounds Extremities: iscemic appearing feet  Lab Results Recent Labs    04/17/18 1524 04/17/18 2331 04/18/18 0419  WBC  --  49.4* 43.2*  HGB  --  6.6* 7.1*  HCT  --  21.1* 22.8*  NA 136  --  135  K 5.5*  --  5.2*  CL 103  --  102  CO2 13*  --  19*  BUN 40*  --  39*  CREATININE 1.39*  --  1.44*     Liver Panel Recent Labs    04/16/18 0342  04/17/18 1524 04/18/18 0419  PROT 4.6*  --   --  6.0*  ALBUMIN 1.4*   < > 1.9* 2.2*  AST 68*  --   --  4,900*  ALT 23  --   --  627*  ALKPHOS 247*  --   --  500*  BILITOT 7.6*  --   --  21.0*   < > = values in this interval not displayed.   Sedimentation Rate No results for input(s): ESRSEDRATE in the last 72 hours. C-Reactive Protein No results for input(s): CRP in the last 72 hours.  Microbiology: Recent Results (from the past 240 hour(s))  Culture, blood (Routine x 2)     Status: Abnormal   Collection Time: 03/20/2018  2:25 PM  Result Value Ref Range Status   Specimen Description   Final    BLOOD LEFT FOREARM Performed at [facility], [address], [place], [place] [address]    Special Requests   Final    BOTTLES DRAWN AEROBIC AND ANAEROBIC Blood Culture adequate volume Performed at [facility], [address] [place], [place] [address]    Culture  Setup Time   Final    GRAM POSITIVE COCCI Gram Stain Report Called to,Read Back By and Verified With: [place] @ 0735 ON [id number] BY HEDNERSON L. CRITICAL RESULT CALLED TO, READ BACK BY AND VERIFIED WITH: M. Prentiss Bells, PHARMD AT 1055 ON 04/15/18 BY C. JESSUP, MLT. Performed at [facility], [place] [address], [place], [place] [address]    Culture STAPHYLOCOCCUS AUREUS (A)  Final   Report Status 04/17/2018 FINAL  Final   Organism ID, Bacteria STAPHYLOCOCCUS AUREUS  Final      Susceptibility   Staphylococcus aureus - MIC*    CIPROFLOXACIN <=0.5 SENSITIVE Sensitive     ERYTHROMYCIN <=0.25 SENSITIVE Sensitive     GENTAMICIN <=0.5 SENSITIVE Sensitive     OXACILLIN <=0.25 SENSITIVE Sensitive     TETRACYCLINE <=1 SENSITIVE Sensitive     VANCOMYCIN 1 SENSITIVE Sensitive     TRIMETH/SULFA <=10 SENSITIVE Sensitive     CLINDAMYCIN <=0.25 SENSITIVE Sensitive     RIFAMPIN <=0.5 SENSITIVE Sensitive     Inducible Clindamycin NEGATIVE Sensitive     * STAPHYLOCOCCUS AUREUS   Culture, blood (Routine x 2)     Status: Abnormal   Collection Time: 04/04/2018  2:25 PM  Result Value Ref Range Status   Specimen Description  Final    BLOOD LEFT FOREARM Performed at [facility], [address], [place], [place] [address]    Special Requests   Final    BOTTLES DRAWN AEROBIC AND ANAEROBIC Blood Culture adequate volume Performed at [facility], [address] [place], [place] [address]    Culture  Setup Time   Final    GRAM POSITIVE COCCI Gram Stain Report Called to,Read Back By and Verified With: [place] @ 0802 ON [id number] HENDERSON L. CRITICAL RESULT CALLED TO, READ BACK BY AND VERIFIED WITH: M. Prentiss Bells, PHARMD AT 1055 ON 04/15/18 BY C. JESSUP, MLT.    Culture (A)  Final    STAPHYLOCOCCUS AUREUS SUSCEPTIBILITIES PERFORMED ON PREVIOUS CULTURE WITHIN THE LAST 5 DAYS. Performed at [facility], [place] [address], [place], [place] [address]    Report Status 04/17/2018 FINAL  Final  Blood Culture ID Panel (Reflexed)     Status: Abnormal   Collection Time: 03/29/2018  2:25 PM  Result Value Ref Range Status   Enterococcus species NOT DETECTED NOT DETECTED Final   Listeria monocytogenes NOT DETECTED NOT DETECTED Final   Staphylococcus species DETECTED (A) NOT DETECTED Final    Comment: CRITICAL RESULT CALLED TO, READ BACK BY AND VERIFIED WITH: M. Prentiss Bells, PHARMD AT 1055 ON 04/15/18 BY C. JESSUP, MLT.    Staphylococcus aureus (BCID) DETECTED (A) NOT DETECTED Final    Comment: Methicillin (oxacillin) susceptible Staphylococcus aureus (MSSA). Preferred therapy is anti staphylococcal beta lactam antibiotic (Cefazolin or Nafcillin), unless clinically contraindicated. CRITICAL RESULT CALLED TO, READ BACK BY AND VERIFIED WITH: M. Prentiss Bells, PHARMD AT 1055 ON 04/15/18 BY C. JESSUP, MLT.    Methicillin resistance NOT DETECTED NOT DETECTED Final   Streptococcus species NOT DETECTED NOT DETECTED Final   Streptococcus agalactiae NOT DETECTED NOT DETECTED Final    Streptococcus pneumoniae NOT DETECTED NOT DETECTED Final   Streptococcus pyogenes NOT DETECTED NOT DETECTED Final   Acinetobacter baumannii NOT DETECTED NOT DETECTED Final   Enterobacteriaceae species NOT DETECTED NOT DETECTED Final   Enterobacter cloacae complex NOT DETECTED NOT DETECTED Final   Escherichia coli NOT DETECTED NOT DETECTED Final   Klebsiella oxytoca NOT DETECTED NOT DETECTED Final   Klebsiella pneumoniae NOT DETECTED NOT DETECTED Final   Proteus species NOT DETECTED NOT DETECTED Final   Serratia marcescens NOT DETECTED NOT DETECTED Final   Haemophilus influenzae NOT DETECTED NOT DETECTED Final   Neisseria meningitidis NOT DETECTED NOT DETECTED Final   Pseudomonas aeruginosa NOT DETECTED NOT DETECTED Final   Candida albicans NOT DETECTED NOT DETECTED Final   Candida glabrata NOT DETECTED NOT DETECTED Final   Candida krusei NOT DETECTED NOT DETECTED Final   Candida parapsilosis NOT DETECTED NOT DETECTED Final   Candida tropicalis NOT DETECTED NOT DETECTED Final    Comment: Performed at [facility], [place] [address] [place], [place] [address]  Urine culture     Status: Abnormal (Preliminary result)   Collection Time: 04/13/2018  8:54 PM  Result Value Ref Range Status   Specimen Description URINE, CATHETERIZED  Final   Special Requests NONE  Final   Culture (A)  Final    >=100,000 COLONIES/mL ESCHERICHIA COLI 70,000 COLONIES/mL STAPHYLOCOCCUS AUREUS SUSCEPTIBILITIES TO FOLLOW FOR ORGANISM 2 Performed at [facility], [place] [address], [place], [place] [address]    Report Status PENDING  Incomplete   Organism ID, Bacteria ESCHERICHIA COLI (A)  Final      Susceptibility   Escherichia coli - MIC*  AMPICILLIN >=32 RESISTANT Resistant     CEFAZOLIN 16 SENSITIVE Sensitive     CEFTRIAXONE <=1 SENSITIVE Sensitive     CIPROFLOXACIN 0.5 SENSITIVE Sensitive     GENTAMICIN <=1 SENSITIVE Sensitive     IMIPENEM <=0.25 SENSITIVE Sensitive     NITROFURANTOIN <=16  SENSITIVE Sensitive     TRIMETH/SULFA >=320 RESISTANT Resistant     AMPICILLIN/SULBACTAM >=32 RESISTANT Resistant     PIP/TAZO 64 INTERMEDIATE Intermediate     Extended ESBL NEGATIVE Sensitive     * >=100,000 COLONIES/mL ESCHERICHIA COLI  MRSA PCR Screening     Status: None   Collection Time: 04/15/2018  8:57 PM  Result Value Ref Range Status   MRSA by PCR NEGATIVE NEGATIVE Final    Comment:        The GeneXpert MRSA Assay (FDA approved for NASAL specimens only), is one component of a comprehensive MRSA colonization surveillance program. It is not intended to diagnose MRSA infection nor to guide or monitor treatment for MRSA infections. Performed at [facility], [place] [address] [place], [place] [address]   Respiratory Panel by PCR     Status: None   Collection Time: 03/20/2018  9:03 PM  Result Value Ref Range Status   Adenovirus NOT DETECTED NOT DETECTED Final   Coronavirus 229E NOT DETECTED NOT DETECTED Final   Coronavirus HKU1 NOT DETECTED NOT DETECTED Final   Coronavirus NL63 NOT DETECTED NOT DETECTED Final   Coronavirus OC43 NOT DETECTED NOT DETECTED Final   Metapneumovirus NOT DETECTED NOT DETECTED Final   Rhinovirus / Enterovirus NOT DETECTED NOT DETECTED Final   Influenza A NOT DETECTED NOT DETECTED Final   Influenza B NOT DETECTED NOT DETECTED Final   Parainfluenza Virus 1 NOT DETECTED NOT DETECTED Final   Parainfluenza Virus 2 NOT DETECTED NOT DETECTED Final   Parainfluenza Virus 3 NOT DETECTED NOT DETECTED Final   Parainfluenza Virus 4 NOT DETECTED NOT DETECTED Final   Respiratory Syncytial Virus NOT DETECTED NOT DETECTED Final   Bordetella pertussis NOT DETECTED NOT DETECTED Final   Chlamydophila pneumoniae NOT DETECTED NOT DETECTED Final   Mycoplasma pneumoniae NOT DETECTED NOT DETECTED Final  Culture, blood (routine x 2)     Status: None (Preliminary result)   Collection Time: 03/25/2018 11:30 PM  Result Value Ref Range Status   Specimen Description BLOOD  RIGHT ARM  Final   Special Requests   Final    BOTTLES DRAWN AEROBIC ONLY Blood Culture results may not be optimal due to an inadequate volume of blood received in culture bottles   Culture   Final    NO GROWTH 2 DAYS Performed at [facility], [address], [place], [place] [address]    Report Status PENDING  Incomplete  Culture, blood (routine x 2)     Status: None (Preliminary result)   Collection Time: 03/22/2018 11:33 PM  Result Value Ref Range Status   Specimen Description BLOOD RIGHT HAND  Final   Special Requests   Final    BOTTLES DRAWN AEROBIC ONLY Blood Culture adequate volume   Culture   Final    NO GROWTH 2 DAYS Performed at [facility], [place] [address], [place], [place] [address]    Report Status PENDING  Incomplete  Culture, blood (Routine X 2) w Reflex to ID Panel     Status: None (Preliminary result)   Collection Time: 04/16/18  6:38 AM  Result Value Ref Range Status   Specimen Description BLOOD LEFT  ANTECUBITAL  Final   Special Requests AEROBIC BOTTLE ONLY Blood Culture adequate volume  Final   Culture   Final    NO GROWTH 1 DAY Performed at [facility], [place] [address], [place], [place] [address]    Report Status PENDING  Incomplete  Culture, blood (Routine X 2) w Reflex to ID Panel     Status: None (Preliminary result)   Collection Time: 04/16/18  6:45 AM  Result Value Ref Range Status   Specimen Description BLOOD BLOOD RIGHT HAND  Final   Special Requests AEROBIC BOTTLE ONLY Blood Culture adequate volume  Final   Culture   Final    NO GROWTH 1 DAY Performed at [facility], [place] [address], [place], [place] [address]    Report Status PENDING  Incomplete    Studies/Results: Ct Angio Head W Or Wo Contrast  Result Date: 04/18/2018 CLINICAL DATA:  Subarachnoid hemorrhage EXAM: CT ANGIOGRAPHY HEAD TECHNIQUE: Multidetector CT imaging of the head was performed using the standard protocol during bolus administration of intravenous  contrast. Multiplanar CT image reconstructions and MIPs were obtained to evaluate the vascular anatomy. CONTRAST:  130m ISOVUE-370 IOPAMIDOL (ISOVUE-370) INJECTION 76% COMPARISON:  Head CT 04/17/2018 FINDINGS: CTA HEAD FINDINGS ANTERIOR CIRCULATION: --Intracranial internal carotid arteries: Normal. --Anterior cerebral arteries: Normal. Both A1 segments are present. Patent anterior communicating artery. --Middle cerebral arteries: Normal. --Posterior communicating arteries: Absent bilaterally. POSTERIOR CIRCULATION: --Basilar artery: Normal. --Posterior cerebral arteries: Normal. --Superior cerebellar arteries: Normal. --Inferior cerebellar arteries: Normal anterior and posterior inferior cerebellar arteries. VENOUS SINUSES: As permitted by contrast timing, patent. ANATOMIC VARIANTS: None DELAYED PHASE: No parenchymal contrast enhancement. Review of the MIP images confirms the above findings. IMPRESSION: No intracranial aneurysm, occlusion or hemodynamically significant stenosis. Electronically Signed   By: KUlyses JarredM.D.   On: 04/18/2018 02:02   Ct Head Wo Contrast  Result Date: 04/17/2018 CLINICAL DATA:  Inpatient. Altered level of consciousness. Anisocoria. Endocarditis. EXAM: CT HEAD WITHOUT CONTRAST TECHNIQUE: Contiguous axial images were obtained from the base of the skull through the vertex without intravenous contrast. COMPARISON:  03/18/2018 head CT FINDINGS: Brain: There is a small volume of acute subarachnoid hemorrhage within the medial right parieto-occipital sulci greater than superior left parietal sulci. No evidence of parenchymal hemorrhage. No mass lesion, mass effect, or midline shift. No CT evidence of acute infarction. Cerebral volume is age appropriate. No ventriculomegaly. Vascular: No appreciable acute abnormality. Skull: No evidence of calvarial fracture. Sinuses/Orbits: The visualized paranasal sinuses are essentially clear. Other: Partial bilateral mastoid effusions. IMPRESSION:  1. Small volume acute subarachnoid hemorrhage within the medial right parieto-occipital sulci greater than superior left parietal sulci. No mass effect or midline shift. No hydrocephalus. 2. Partial bilateral mastoid effusions. Critical Value/emergent results were called by telephone at the time of interpretation on 04/17/2018 at 11:00 pm to Dr. SBoone Master, who verbally acknowledged these results. Electronically Signed   By: JIlona SorrelM.D.   On: 04/17/2018 23:04   Dg Chest Port 1 View  Result Date: 04/18/2018 CLINICAL DATA:  Acute respiratory failure. EXAM: PORTABLE CHEST 1 VIEW COMPARISON:  04/17/2018 FINDINGS: Endotracheal tube terminates 2.7 cm above the carina. Left jugular catheter terminates over the lower SVC. A right PICC has been placed and appears to terminates well within the right atrium although its tip is poorly delineated due to superimposition of the spine and mediastinal structures resulting in mild under penetration of this region. The cardiac silhouette is normal in size. Large, rounded area  of consolidation in the right lung base is stable to slightly increased. Rounded lucent areas along its superior margin are slightly more conspicuous, and there is also increasing patchy opacity in the right upper lobe with a newly apparent rounded lucency. Patchy and hazy opacity throughout the left lung has increased. No large pleural effusion or pneumothorax is identified. IMPRESSION: 1. Interval right PICC placement. Tip likely deep in the right atrium. 2. Worsening appearance of the lungs bilaterally. Asymmetric consolidation with new/increasing cavitary lesions in the right lung likely reflecting septic emboli. Suspected superimposed pulmonary edema. Electronically Signed   By: Logan Bores M.D.   On: 04/18/2018 07:21   Dg Chest Port 1 View  Result Date: 04/17/2018 CLINICAL DATA:  ARDS. EXAM: PORTABLE CHEST 1 VIEW COMPARISON:  04/16/2018. FINDINGS: Endotracheal tube, NG tube, left IJ line  stable position. Heart size normal. Bilateral pulmonary infiltrates again noted. Interim improvement from prior exam. No pleural effusion or pneumothorax. IMPRESSION: 1.  Lines and tubes in stable position. 2. Bilateral pulmonary infiltrates again noted. Interim improvement from prior exam. Electronically Signed   By: Marcello Moores  Register   On: 04/17/2018 05:34   Dg Chest Port 1 View  Result Date: 04/16/2018 CLINICAL DATA:  Central line placement EXAM: PORTABLE CHEST 1 VIEW COMPARISON:  04/16/2018 FINDINGS: Right subclavian central line, endotracheal tube and NG tube remain in place, unchanged. Interval placement of left internal jugular dialysis catheter with the tip at the cavoatrial junction. No pneumothorax. Heart is normal size. Bilateral airspace opacities could reflect edema or infection, slightly improved since prior study. IMPRESSION: Left dialysis catheter placement with the tip at the cavoatrial junction. No pneumothorax. Bilateral airspace disease slightly improved, likely improving edema or infection. Electronically Signed   By: Rolm Baptise M.D.   On: 04/16/2018 18:39   Vas [place] Burnard Bunting With/wo Tbi  Result Date: 04/17/2018 LOWER EXTREMITY DOPPLER STUDY Indications: Dusky toes.  Performing Technologist: Carlos Levering Rvt  Examination Guidelines: A complete evaluation includes at minimum, Doppler waveform signals and systolic blood pressure reading at the level of bilateral brachial, anterior tibial, and posterior tibial arteries, when vessel segments are accessible. Bilateral testing is considered an integral part of a complete examination. Photoelectric Plethysmograph (PPG) waveforms and toe systolic pressure readings are included as required and additional duplex testing as needed. Limited examinations for reoccurring indications may be performed as noted.  ABI Findings: +---------+------------------+-----+--------+----------------------------------+ Right    Rt Pressure  (mmHg)IndexWaveformComment                            +---------+------------------+-----+--------+----------------------------------+ Brachial                                Unable to obtain pressure due to                                           the presence of an A line          +---------+------------------+-----+--------+----------------------------------+ PTA                                     Unable to obtain a dopplerable  signal                             +---------+------------------+-----+--------+----------------------------------+ DP                                      Unable to obtain a dopplerable                                             signal                             +---------+------------------+-----+--------+----------------------------------+ Great Toe                               Unable to obtain a dopplerable                                             signal                             +---------+------------------+-----+--------+----------------------------------+ +---------+-----------------+-----+--------+-----------------------------------+ Left     Lt Pressure      IndexWaveformComment                                      (mmHg)                                                            +---------+-----------------+-----+--------+-----------------------------------+ Brachial 115                   biphasic                                    +---------+-----------------+-----+--------+-----------------------------------+ PTA                                    Unable to obtain a dopplerable                                             signal                              +---------+-----------------+-----+--------+-----------------------------------+ DP                                     Unable to obtain a dopplerable  signal                              +---------+-----------------+-----+--------+-----------------------------------+ Great Toe                              Unable to obtain a dopplerable                                             signal                              +---------+-----------------+-----+--------+-----------------------------------+  Summary: Right: Unable to obtain a dopplerable signal in the dorsalis pedis and posterior tibial arteries. Unable to detect great toe signal. Left: Unable to obtain a dopplerable signal in the dorsalis pedis and posterior tibial arteries. Unable to detect great toe signal.  *See table(s) above for measurements and observations.  Electronically signed by Servando Snare MD on 04/17/2018 at 5:06:08 PM.    Final    [place] Ekg Site Rite  Result Date: 04/17/2018 If Site Rite image not attached, placement could not be confirmed due to current cardiac rhythm.    Assessment/Plan: Sepsis MSSA bacteremia (12-28 [place] Pen) with septic emboli Polysubs abuse VDRF Thrombocytopenia secondary to sepsis Cecal/colonic thickening E coli UTI ARF on CRRT Protein calorie malnutrition, severe Incarcerated  Total days of antibiotics:5 ancef, 2 flagyl  She continues to do poorly.  She appears to have shock liver/liver failure.  Severe thrombocytopenia Family meeting today Available as needed.           Bobby Rumpf MD, FACP Infectious Diseases (pager) [phone] [email or web address] 04/18/2018, 8:34 AM  LOS: 4 days

## 2018-04-18 NOTE — Progress Notes (Signed)
Notified MD Arsenio Loader about critical lab values, hemoglobin 6.6 and INR 4.92. New orders received. Will continue to monitor.

## 2018-04-18 NOTE — Progress Notes (Signed)
eLink Physician-Brief Progress Note Patient Name: Frances Le DOB: 1986-03-15 MRN: [id number]   Date of Service  04/18/2018  HPI/Events of Note  Multiple issues: 1. Anemia - Hgb = 6.6 and 2. Platelets = 9  eICU Interventions  Will order: 1. Transfuse 1 unit PRBC. 2. Transfuse 2 units Single Donor Platelets.     Intervention Category Major Interventions: Other:  Sommer,Steven Dennard Nip 04/18/2018, 12:24 AM

## 2018-04-18 NOTE — Progress Notes (Signed)
STROKE TEAM PROGRESS NOTE   SUBJECTIVE (INTERVAL HISTORY) Her RN and aunt is at the bedside. Pt intubated not responsive, no brainstem functions and not breathing over the vent. She was just off sedation one hour ago with fentanyl and several hours ago with versed. Will need to repeat brain death exam tomorrow.    OBJECTIVE Temp:  [96.3 F (35.7 C)-99 F (37.2 C)] 97.3 F (36.3 C) (01/01 1300) Pulse Rate:  [96-123] 102 (01/01 1300) Cardiac Rhythm: Sinus tachycardia (01/01 1200) Resp:  [27-32] 30 (01/01 1300) BP: (93-143)/(46-83) 143/77 (01/01 1133) SpO2:  [94 %-100 %] 100 % (01/01 1300) Arterial Line BP: (85-148)/(45-81) 96/47 (01/01 1300) FiO2 (%):  [40 %-100 %] 100 % (01/01 1200)  Recent Labs  Lab 04/17/18 2009 04/17/18 2334 04/18/18 0418 04/18/18 0757 04/18/18 1120  GLUCAP 157* 138* 99 125* 105*   Recent Labs  Lab 04/15/18 2222 04/16/18 0050 04/16/18 0342 04/16/18 1502 04/16/18 1600 04/17/18 0340 04/17/18 1524 04/18/18 0419  NA  --  141 141 142  --  139 136 135  K  --  4.3 3.7 5.5*  --  5.2* 5.5* 5.2*  CL  --  114* 113* 109  --  106 103 102  CO2  --  16* 15* 18*  --  16* 13* 19*  GLUCOSE  --  93 70 101*  --  130* 137* 113*  BUN  --  76* 77* 80*  --  63* 40* 39*  CREATININE  --  1.60* 1.92* 1.89*  --  1.68* 1.39* 1.44*  CALCIUM  --  7.4* 7.4* 7.3*  --  6.9* 7.2* 7.6*  MG 2.5* 2.5*  --   --  2.6* 2.6*  --  2.7*  PHOS 4.8* 5.4*  --   --  10.6* 8.4* 7.4* 6.6*   Recent Labs  Lab Apr 27, 2018 1425 April 27, 2018 2333 04/16/18 0342 04/17/18 0340 04/17/18 1524 04/18/18 0419  AST 53* 52* 68*  --   --  4,900*  ALT 35 29 23  --   --  627*  ALKPHOS 139* 129* 247*  --   --  500*  BILITOT 6.8* 5.7* 7.6*  --   --  21.0*  PROT 5.8* 4.6* 4.6*  --   --  6.0*  ALBUMIN 2.1* 1.5* 1.4* 1.8* 1.9* 2.2*   Recent Labs  Lab 04-27-18 2333  04/16/18 0342 04/17/18 0340 04/17/18 1109 04/17/18 2331 04/18/18 0419  WBC 35.0*   < > 58.8* 66.6* 63.4* 49.4* 43.2*  NEUTROABS 30.1*  --    --  50.8* 48.8* 43.0* 39.7*  HGB 10.4*   < > 8.1* 6.8* 8.4* 6.6* 7.1*  HCT 31.2*   < > 25.9* 22.6* 26.3* 21.1* 22.8*  MCV 85.7   < > 92.2 95.0 95.3 93.8 89.1  PLT 7*   < > 56* 10* 9*  8* 9* 8*   < > = values in this interval not displayed.   Recent Labs  Lab 2018/04/27 2333 04/15/18 0250 04/15/18 0820  TROPONINI 0.15* 0.14* 0.12*   Recent Labs    04/17/18 1109 04/17/18 2331  LABPROT 54.3* 45.0*  INR 6.26* 4.92*   No results for input(s): COLORURINE, LABSPEC, PHURINE, GLUCOSEU, HGBUR, BILIRUBINUR, KETONESUR, PROTEINUR, UROBILINOGEN, NITRITE, LEUKOCYTESUR in the last 72 hours.  Invalid input(s): APPERANCEUR  No results found for: CHOL, TRIG, HDL, CHOLHDL, VLDL, LDLCALC No results found for: HGBA1C    Component Value Date/Time   LABOPIA POSITIVE (A) 12/17/2017 1043   COCAINSCRNUR POSITIVE (A) 12/17/2017 1043  LABBENZ TEST NOT PERFORMED, REAGENT NOT AVAILABLE (A) 12/17/2017 1043   AMPHETMU POSITIVE (A) 12/17/2017 1043   THCU POSITIVE (A) 12/17/2017 1043   LABBARB NONE DETECTED 12/17/2017 1043    No results for input(s): ETH in the last 168 hours.  I have personally reviewed the radiological images below and agree with the radiology interpretations.  Ct Abdomen Pelvis Wo Contrast  Result Date: 04/15/2018 CLINICAL DATA:  Acute onset of sepsis. EXAM: CT CHEST, ABDOMEN AND PELVIS WITHOUT CONTRAST TECHNIQUE: Multidetector CT imaging of the chest, abdomen and pelvis was performed following the standard protocol without IV contrast. COMPARISON:  CT of the abdomen and pelvis performed 11/26/2017, and chest radiograph performed 04/29/2018 FINDINGS: CT CHEST FINDINGS Cardiovascular: The heart is normal in size. The thoracic aorta is grossly unremarkable, though difficult to fully assess without contrast. The great vessels are unremarkable in appearance. Mediastinum/Nodes: The mediastinum is unremarkable in appearance. No definite mediastinal lymphadenopathy is seen. No pericardial  effusion is identified. The thyroid gland is grossly unremarkable. No axillary lymphadenopathy is appreciated. Lungs/Pleura: Scattered nodular airspace opacities are noted bilaterally, with larger airspace opacities on the right, measuring up to 5.6 cm in size. This is suspicious for septic emboli. Underlying diffuse interstitial prominence is noted, with trace bilateral pleural fluid, concerning for mild pulmonary edema. No pneumothorax is seen. Musculoskeletal: No acute osseous abnormalities are identified. The visualized musculature is unremarkable in appearance. CT ABDOMEN PELVIS FINDINGS Hepatobiliary: The liver is unremarkable in appearance. The gallbladder is unremarkable in appearance. The common bile duct remains normal in caliber. Pancreas: The pancreas is within normal limits. Spleen: The spleen is unremarkable in appearance. Adrenals/Urinary Tract: The adrenal glands are unremarkable in appearance. There is question of haziness about the left renal pelvis, and mild left-sided perinephric stranding is seen. There is no evidence of hydronephrosis. No renal or ureteral stones are identified. Stomach/Bowel: The stomach is unremarkable in appearance. The small bowel is within normal limits. The appendix is normal in caliber, without evidence of appendicitis. There is question of mild wall thickening at the cecum and proximal ascending colon, which could reflect a mild infectious or inflammatory process. Mild diffuse mesenteric edema is noted. Vascular/Lymphatic: The abdominal aorta is grossly unremarkable, though not well assessed without contrast. No definite focal venous abnormalities are characterized, though the venous structures are also not well assessed. No definite retroperitoneal or pelvic sidewall lymphadenopathy is seen. Reproductive: The bladder is mildly distended, and contains air, secondary to Foley catheter placement. The uterus is grossly unremarkable in appearance. No suspicious adnexal  masses are seen. Other: A small amount of ascites is seen within the pelvis. Musculoskeletal: No acute osseous abnormalities are identified. The visualized musculature is unremarkable in appearance. IMPRESSION: 1. Scattered nodular airspace opacities bilaterally, with larger airspace opacities on the right, measuring up to 5.6 cm in size. This is suspicious for diffuse septic emboli. 2. Underlying diffuse interstitial prominence, with trace bilateral pleural fluid, concerning for mild pulmonary edema. 3. Question of mild wall thickening at the cecum and proximal ascending colon, which could reflect a mild infectious or inflammatory process. 4. Mild diffuse mesenteric edema noted. 5. Question of haziness about the left renal pelvis, and mild left-sided perinephric stranding. Would correlate for any evidence of left-sided pyelonephritis. 6. Small amount of ascites within the pelvis. Electronically Signed   By: Roanna Raider M.D.   On: 04/15/2018 01:01   Ct Angio Head W Or Wo Contrast  Result Date: 04/18/2018 CLINICAL DATA:  Subarachnoid hemorrhage EXAM: CT ANGIOGRAPHY HEAD  TECHNIQUE: Multidetector CT imaging of the head was performed using the standard protocol during bolus administration of intravenous contrast. Multiplanar CT image reconstructions and MIPs were obtained to evaluate the vascular anatomy. CONTRAST:  ISOVUE-370 IOPAMIDOL (ISOVUE-370) INJECTION 76% COMPARISON:  Head CT 04/17/2018 FINDINGS: CTA HEAD FINDINGS ANTERIOR CIRCULATION: --Intracranial internal carotid arteries: Normal. --Anterior cerebral arteries: Normal. Both A1 segments are present. Patent anterior communicating artery. --Middle cerebral arteries: Normal. --Posterior communicating arteries: Absent bilaterally. POSTERIOR CIRCULATION: --Basilar artery: Normal. --Posterior cerebral arteries: Normal. --Superior cerebellar arteries: Normal. --Inferior cerebellar arteries: Normal anterior and posterior inferior cerebellar arteries.  VENOUS SINUSES: As permitted by contrast timing, patent. ANATOMIC VARIANTS: None DELAYED PHASE: No parenchymal contrast enhancement. Review of the MIP images confirms the above findings. IMPRESSION: No intracranial aneurysm, occlusion or hemodynamically significant stenosis. Electronically Signed   By: Deatra Robinson M.D.   On: 04/18/2018 02:02   Dg Chest 1 View  Addendum Date: 04/15/2018   ADDENDUM REPORT: 04/15/2018 16:12 ADDENDUM: Right IJ central line catheter terminates in the mid SVC. No pneumothorax. Electronically Signed   By: Tollie Eth M.D.   On: 04/15/2018 16:12   Result Date: 04/15/2018 CLINICAL DATA:  Right-sided central line placement and endotracheal tube assessment. Substance abuse. EXAM: CHEST  1 VIEW COMPARISON:  None. FINDINGS: The patient is tilted to the left. Endotracheal tube tip is 2.7 cm above the carina. Gastric tube extends below the left hemidiaphragm though the tip and side port are excluded and not visualized on this study. Interval increase in pulmonary vascular congestion, left greater than right. Nodular masslike opacities project over the right mid and lower lobes suspicious for possible septic emboli given history of substance abuse. No acute osseous abnormality. IMPRESSION: 1. Endotracheal tube tip is 2.7 cm above the carina. 2. Gastric tube extends below the left hemidiaphragm though the tip and side port are excluded on this study. 3. Nodular masslike opacities project over the right mid and lower lobes, suspicious for possible septic emboli given history of substance abuse. 4. Interval increase in pulmonary vascular congestion more left-sided. Asymmetric pulmonary edema due to patient positioning might account this appearance. Electronically Signed: By: Tollie Eth M.D. On: 04/15/2018 15:18   Dg Chest 1 View  Result Date: 04/06/2018 CLINICAL DATA:  Sepsis. Heroin withdrawal. EXAM: CHEST  1 VIEW COMPARISON:  11/26/2017 FINDINGS: Normal heart size and mediastinal  contours. Confluent opacities are noted in both upper lobes and to a greater extent in the right lower lobe without cavitation. Multilobar pneumonia might account for this or potentially a septic emboli without cavitation. No effusion or edema. No bone destruction or fracture. IMPRESSION: Confluent opacities in both upper lobes and to a greater extent in the right lower lobe without cavitation. Multilobar pneumonia might account for this or potentially non cavitary septic emboli given patient history. Electronically Signed   By: Tollie Eth M.D.   On: 04/01/2018 15:30   Ct Head Wo Contrast  Result Date: 04/17/2018 CLINICAL DATA:  Inpatient. Altered level of consciousness. Anisocoria. Endocarditis. EXAM: CT HEAD WITHOUT CONTRAST TECHNIQUE: Contiguous axial images were obtained from the base of the skull through the vertex without intravenous contrast. COMPARISON:  03/23/2018 head CT FINDINGS: Brain: There is a small volume of acute subarachnoid hemorrhage within the medial right parieto-occipital sulci greater than superior left parietal sulci. No evidence of parenchymal hemorrhage. No mass lesion, mass effect, or midline shift. No CT evidence of acute infarction. Cerebral volume is age appropriate. No ventriculomegaly. Vascular: No appreciable acute abnormality. Skull:  No evidence of calvarial fracture. Sinuses/Orbits: The visualized paranasal sinuses are essentially clear. Other: Partial bilateral mastoid effusions. IMPRESSION: 1. Small volume acute subarachnoid hemorrhage within the medial right parieto-occipital sulci greater than superior left parietal sulci. No mass effect or midline shift. No hydrocephalus. 2. Partial bilateral mastoid effusions. Critical Value/emergent results were called by telephone at the time of interpretation on 04/17/2018 at 11:00 pm to Dr. Loletha Grayer , who verbally acknowledged these results. Electronically Signed   By: Delbert Phenix M.D.   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 Altered mental status EXAM: CT HEAD WITHOUT CONTRAST TECHNIQUE: Contiguous axial images were obtained from the base of the skull through the vertex without intravenous contrast. COMPARISON:  None. FINDINGS: Brain: Ventricles and sulci are appropriate for patient's age. No evidence for acute cortically based infarct, intracranial hemorrhage, mass lesion or mass-effect. Vascular: Unremarkable Skull: Intact Sinuses/Orbits: Paranasal sinuses are well aerated. Mastoid air cells unremarkable. Orbits are unremarkable. Other: None. IMPRESSION: No acute intracranial process. Electronically Signed   By: Annia Belt M.D.   On: 04/08/2018 15:21   Ct Chest Wo Contrast  Result Date: 04/15/2018 CLINICAL DATA:  Acute onset of sepsis. EXAM: CT CHEST, ABDOMEN AND PELVIS WITHOUT CONTRAST TECHNIQUE: Multidetector CT imaging of the chest, abdomen and pelvis was performed following the standard protocol without IV contrast. COMPARISON:  CT of the abdomen and pelvis performed 11/26/2017, and chest radiograph performed 04/04/2018 FINDINGS: CT CHEST FINDINGS Cardiovascular: The heart is normal in size. The thoracic aorta is grossly unremarkable, though difficult to fully assess without contrast. The great vessels are unremarkable in appearance. Mediastinum/Nodes: The mediastinum is unremarkable in appearance. No definite mediastinal lymphadenopathy is seen. No pericardial effusion is identified. The thyroid gland is grossly unremarkable. No axillary lymphadenopathy is appreciated. Lungs/Pleura: Scattered nodular airspace opacities are noted bilaterally, with larger airspace opacities on the right, measuring up to 5.6 cm in size. This is suspicious for septic emboli. Underlying diffuse interstitial prominence is noted, with trace bilateral pleural fluid, concerning for mild pulmonary edema. No pneumothorax is seen. Musculoskeletal: No acute osseous  abnormalities are identified. The visualized musculature is unremarkable in appearance. CT ABDOMEN PELVIS FINDINGS Hepatobiliary: The liver is unremarkable in appearance. The gallbladder is unremarkable in appearance. The common bile duct remains normal in caliber. Pancreas: The pancreas is within normal limits. Spleen: The spleen is unremarkable in appearance. Adrenals/Urinary Tract: The adrenal glands are unremarkable in appearance. There is question of haziness about the left renal pelvis, and mild left-sided perinephric stranding is seen. There is no evidence of hydronephrosis. No renal or ureteral stones are identified. Stomach/Bowel: The stomach is unremarkable in appearance. The small bowel is within normal limits. The appendix is normal in caliber, without evidence of appendicitis. There is question of mild wall thickening at the cecum and proximal ascending colon, which could reflect a mild infectious or inflammatory process. Mild diffuse mesenteric edema is noted. Vascular/Lymphatic: The abdominal aorta is grossly unremarkable, though not well assessed without contrast. No definite focal venous abnormalities are characterized, though the venous structures are also not well assessed. No definite retroperitoneal or pelvic sidewall lymphadenopathy is seen. Reproductive: The bladder is mildly distended, and contains air, secondary to Foley catheter placement. The uterus is grossly unremarkable in appearance. No suspicious adnexal masses are seen. Other: A small amount of ascites is seen within the pelvis. Musculoskeletal: No acute osseous abnormalities are identified. The visualized musculature is unremarkable  in appearance. IMPRESSION: 1. Scattered nodular airspace opacities bilaterally, with larger airspace opacities on the right, measuring up to 5.6 cm in size. This is suspicious for diffuse septic emboli. 2. Underlying diffuse interstitial prominence, with trace bilateral pleural fluid, concerning for mild  pulmonary edema. 3. Question of mild wall thickening at the cecum and proximal ascending colon, which could reflect a mild infectious or inflammatory process. 4. Mild diffuse mesenteric edema noted. 5. Question of haziness about the left renal pelvis, and mild left-sided perinephric stranding. Would correlate for any evidence of left-sided pyelonephritis. 6. Small amount of ascites within the pelvis. Electronically Signed   By: Roanna RaiderJeffery  Chang M.D.   On: 04/15/2018 01:01   Dg Chest Port 1 View  Result Date: 04/18/2018 CLINICAL DATA:  Acute respiratory failure. EXAM: PORTABLE CHEST 1 VIEW COMPARISON:  04/17/2018 FINDINGS: Endotracheal tube terminates 2.7 cm above the carina. Left jugular catheter terminates over the lower SVC. A right PICC has been placed and appears to terminates well within the right atrium although its tip is poorly delineated due to superimposition of the spine and mediastinal structures resulting in mild under penetration of this region. The cardiac silhouette is normal in size. Large, rounded area of consolidation in the right lung base is stable to slightly increased. Rounded lucent areas along its superior margin are slightly more conspicuous, and there is also increasing patchy opacity in the right upper lobe with a newly apparent rounded lucency. Patchy and hazy opacity throughout the left lung has increased. No large pleural effusion or pneumothorax is identified. IMPRESSION: 1. Interval right PICC placement. Tip likely deep in the right atrium. 2. Worsening appearance of the lungs bilaterally. Asymmetric consolidation with new/increasing cavitary lesions in the right lung likely reflecting septic emboli. Suspected superimposed pulmonary edema. Electronically Signed   By: Sebastian AcheAllen  Grady M.D.   On: 04/18/2018 07:21   Dg Chest Port 1 View  Result Date: 04/17/2018 CLINICAL DATA:  ARDS. EXAM: PORTABLE CHEST 1 VIEW COMPARISON:  04/16/2018. FINDINGS: Endotracheal tube, NG tube, left IJ line  stable position. Heart size normal. Bilateral pulmonary infiltrates again noted. Interim improvement from prior exam. No pleural effusion or pneumothorax. IMPRESSION: 1.  Lines and tubes in stable position. 2. Bilateral pulmonary infiltrates again noted. Interim improvement from prior exam. Electronically Signed   By: Maisie Fushomas  Register   On: 04/17/2018 05:34   Dg Chest Port 1 View  Result Date: 04/16/2018 CLINICAL DATA:  Central line placement EXAM: PORTABLE CHEST 1 VIEW COMPARISON:  04/16/2018 FINDINGS: Right subclavian central line, endotracheal tube and NG tube remain in place, unchanged. Interval placement of left internal jugular dialysis catheter with the tip at the cavoatrial junction. No pneumothorax. Heart is normal size. Bilateral airspace opacities could reflect edema or infection, slightly improved since prior study. IMPRESSION: Left dialysis catheter placement with the tip at the cavoatrial junction. No pneumothorax. Bilateral airspace disease slightly improved, likely improving edema or infection. Electronically Signed   By: Charlett NoseKevin  Dover M.D.   On: 04/16/2018 18:39   Dg Chest Port 1 View  Result Date: 04/16/2018 CLINICAL DATA:  Acute onset of hypoxia. Decreased O2 saturation. EXAM: PORTABLE CHEST 1 VIEW COMPARISON:  Chest radiograph performed 04/15/2018 FINDINGS: The patient's endotracheal tube is seen ending 3 cm above the carina. An enteric tube is noted extending below the diaphragm. A right IJ line is noted ending about the mid SVC. Bilateral septic emboli are again noted. Underlying bibasilar airspace opacification may reflect pulmonary edema or pneumonia. Small bilateral pleural effusions  are noted. No pneumothorax is seen. The cardiomediastinal silhouette is normal in size. No acute osseous abnormalities are identified. IMPRESSION: 1. Endotracheal tube seen ending 3 cm above the carina. 2. Bilateral septic emboli again noted. Underlying bibasilar airspace opacification may reflect  pulmonary edema or pneumonia. Small bilateral pleural effusions seen. Electronically Signed   By: Roanna Raider M.D.   On: 04/16/2018 01:15   Dg Chest Port 1 View  Result Date: 04/15/2018 CLINICAL DATA:  Droplet precautions. Hx ETT Pt was unresponsive Negative pregnancy test EXAM: PORTABLE CHEST 1 VIEW COMPARISON:  Chest x-ray 04/25/2018, CT of the chest on 04/15/2018 FINDINGS: Endotracheal tube is in place with tip approximately 2.9 centimeters above the carina. Nasogastric tube is in place, tip overlying the level of the stomach. Persistent significant lung opacities, including masslike densities, appear stable. IMPRESSION: 1. Stable appearance of the chest. 2. Endotracheal tube is in place with tip approximately 2.9 centimeters above the carina. Electronically Signed   By: Norva Pavlov M.D.   On: 04/15/2018 10:51   Dg Chest Port 1 View  Result Date: 04/25/18 CLINICAL DATA:  Sepsis.  History of heroin use. EXAM: PORTABLE CHEST 1 VIEW COMPARISON:  Apr 25, 2018 at 1513 hours FINDINGS: The cardiomediastinal silhouette is unchanged with normal heart size. There are persistent confluent nodular/partly masslike opacities in the right lower lung which are similar to the prior study. Diffuse reticulonodular densities are again seen throughout both lungs, and there are Kerley lines noted peripherally in both lungs which are new from the prior study. No pleural effusion or pneumothorax is identified. IMPRESSION: Persistent confluent, nodular opacity in the right lower lung with reticulonodular densities diffusely throughout both lungs. Findings may reflect pneumonia and possibly septic emboli. Superimposed interstitial edema is also possible. Radiographic follow-up recommended to exclude underlying neoplasm. Electronically Signed   By: Sebastian Ache M.D.   On: 25-Apr-2018 21:59   Vas [place] Vanice Sarah With/wo Tbi  Result Date: 04/17/2018 LOWER EXTREMITY DOPPLER STUDY Indications: Dusky toes.  Performing Technologist:  Olen Cordial Rvt  Examination Guidelines: A complete evaluation includes at minimum, Doppler waveform signals and systolic blood pressure reading at the level of bilateral brachial, anterior tibial, and posterior tibial arteries, when vessel segments are accessible. Bilateral testing is considered an integral part of a complete examination. Photoelectric Plethysmograph (PPG) waveforms and toe systolic pressure readings are included as required and additional duplex testing as needed. Limited examinations for reoccurring indications may be performed as noted.  ABI Findings: +---------+------------------+-----+--------+----------------------------------+ Right    Rt Pressure (mmHg)IndexWaveformComment                            +---------+------------------+-----+--------+----------------------------------+ Brachial                                Unable to obtain pressure due to                                           the presence of an A line          +---------+------------------+-----+--------+----------------------------------+ PTA                                     Unable to obtain a dopplerable  signal                             +---------+------------------+-----+--------+----------------------------------+ DP                                      Unable to obtain a dopplerable                                             signal                             +---------+------------------+-----+--------+----------------------------------+ Great Toe                               Unable to obtain a dopplerable                                             signal                             +---------+------------------+-----+--------+----------------------------------+ +---------+-----------------+-----+--------+-----------------------------------+ Left     Lt Pressure      IndexWaveformComment                                       (mmHg)                                                            +---------+-----------------+-----+--------+-----------------------------------+ Brachial 115                   biphasic                                    +---------+-----------------+-----+--------+-----------------------------------+ PTA                                    Unable to obtain a dopplerable                                             signal                              +---------+-----------------+-----+--------+-----------------------------------+ DP                                     Unable to obtain a dopplerable  signal                              +---------+-----------------+-----+--------+-----------------------------------+ Great Toe                              Unable to obtain a dopplerable                                             signal                              +---------+-----------------+-----+--------+-----------------------------------+  Summary: Right: Unable to obtain a dopplerable signal in the dorsalis pedis and posterior tibial arteries. Unable to detect great toe signal. Left: Unable to obtain a dopplerable signal in the dorsalis pedis and posterior tibial arteries. Unable to detect great toe signal.  *See table(s) above for measurements and observations.  Electronically signed by Lemar Livings MD on 04/17/2018 at 5:06:08 PM.    Final    [place] Ekg Site Rite  Result Date: 04/17/2018 If Site Rite image not attached, placement could not be confirmed due to current cardiac rhythm.  [place] Ekg Site Rite  Result Date: 04/15/2018 If Site Rite image not attached, placement could not be confirmed due to current cardiac rhythm.   PHYSICAL EXAM  Temp:  [96.3 F (35.7 C)-99 F (37.2 C)] 97.3 F (36.3 C) (01/01 1300) Pulse Rate:  [96-123] 102 (01/01 1300) Resp:  [27-32] 30 (01/01 1300) BP:  (93-143)/(46-83) 143/77 (01/01 1133) SpO2:  [94 %-100 %] 100 % (01/01 1300) Arterial Line BP: (85-148)/(45-81) 96/47 (01/01 1300) FiO2 (%):  [40 %-100 %] 100 % (01/01 1200)  General - Well nourished, well developed, intubated.  Ophthalmologic - fundi not visualized due to noncooperation.  Cardiovascular - Regular rate and rhythm.  Neuro -intubated, nonresponsive.  Not open eyes to voice or stimulation.  Pupil dilated, left pupil 6 mm, right pupil 8 mm, no peppered reflex, no corneal reflex, no gag or cough reflex.  Cold calorics testing no response bilaterally. Not breathing over the vent.  No movement on pain stimulation in all extremities.  DTR diminished and no Babinski.    ASSESSMENT/PLAN Ms. ROXANE PUERTO is a 33 y.o. female with history of bipolar disorder, drug abuse, incarcerated for cocaine intoxication admitted for altered mental status and unresponsive. No tPA given due to [facility].    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  Resultant likely brain death  CT bilateral small SAH at parietal lobe  CTA head no large mycotic aneurysms  TTE showed tricuspid valve vegetation, EF 75 to 80%.  SCDs for VTE prophylaxis  No antithrombotic prior to admission, now on No antithrombotic due to [facility] and endocarditis  Ongoing aggressive stroke risk factor management  Possible brain death  Current exam suggestive of brain death  However patient just off fentanyl for 1 hour, Versed for several hours, as well as AKI on CRRT  Will need repeat brain death exam in a.m.   Discussed with CCM Dr. Kendrick Fries regarding apnea test versus brain flow study in a.m.  Maintain hemodynamic stable as able  Maintain electrolyte balance as able  Numerous severe medical conditions  DIC - supported by  severe coagulopathy, thrombocytopenia and anemia  Endocarditis -TTE showed TV vegetation  Bacteremia blood culture showed E. coli and MSSA, ID  on board, currently on Ancef  Severe anemia, received blood transfusion hemoglobin 6.6-7.1  Severe thrombocytopenia, due to DIC, platelet 8-10  Coagulopathy, due to DIC and liver failure, INR 6.26  Acute liver failure, AST ALT 4900/627, positive hepatitis C  Septic shock, elevated lactic acid, severe hypotension, on Levophed and vasopressin, BP 90s.  AKI, creatinine 1.44, hyperphosphatemia, hyperkalemia, elevated magnesium, received CRRT   Severe leukocytosis, WBC 66.6-43.2  Other Active Problems  History of IV drug abuse -UDS not tested this time  Incarceration PTA  Hospital day # 4  This patient is critically ill due to subarachnoid hemorrhage, possible brain death, DIC, septic shock, endocarditis, severe anemia and thrombocytopenia anticoagulopathy with liver failure and at significant risk of neurological worsening, death form DIC, septic shock, coagulopathy. This patient's care requires constant monitoring of vital signs, hemodynamics, respiratory and cardiac monitoring, review of multiple databases, neurological assessment, discussion with family, other specialists and medical decision making of high complexity. I spent 40 minutes of neurocritical care time in the care of this patient.  I also discussed with Dr. Kendrick Fries.   Marvel Plan, MD PhD Stroke Neurology 04/18/2018 1:23 PM    To contact Stroke Continuity provider, please refer to [email or web address]. After hours, contact General Neurology

## 2018-04-18 NOTE — Progress Notes (Signed)
[facility]  Case discussed with Dr. Shirline Frees and Dr. Glenna Fellows because the ADAMST13 activity is low, suggestive of TTP.  However in review of the literature it has been widely reported that low activity can be associated with a severe infection.  Activity levels < 10% are strongly associated with TTP.  However in her case her activity level is 38% and she has no schistocytes on her peripheral smear and the rest of her labs and clinical picture are consistent with severe DIC which is the most likely diagnosis.  Will not perform plasmapheresis as the utility of this would be very low considering the severity of her infection and reports in the literature of minimal effectiveness in similar clinical scenarios.  Heber La Moille, MD [place] [facility] Pager: [phone] Cell: [phone] If no response, call [phone]

## 2018-04-18 NOTE — Progress Notes (Signed)
Notified MD Arsenio Loader about critical lab, total bilirubin 21.0. Will continue to monitor.

## 2018-04-18 NOTE — Progress Notes (Signed)
CHART NOTE I reviewed the results of the ADAMTS13 that became available earlier today.  It showed deficiency of the enzyme but this is mild deficiency, likely related to her sepsis and DIC.  I do not think the findings are conclusive for a diagnosis of TTP.  She did not have any schistocytes in her peripheral blood smear and the patient had severe sepsis responsible for her multiorgan failure.  I discussed the results with Dr. Kendrick Fries and both of [place] felt that these findings are likely consistent with her current sepsis and DIC rather than the possibility of TTP.  The decision was made not to proceed with any plasma exchange at this point as the patient is currently in a terminal condition and she will not even tolerate it.

## 2018-04-18 NOTE — Plan of Care (Signed)
  Interdisciplinary Goals of Care Family Meeting   Date carried out:: 04/18/2018  Location of the meeting: Conference room  Member's involved: Physician and Family Member or next of kin  Durable Power of Attorney or Environmental health practitioner: Mother  Elianny Antoniak  Discussion: We discussed goals of care for ALYJAH MILWARD .  I explained that Marylene Land has severe multi-organ failure that is worsening despite all interventions we have made.  Specifically she has stroke (subarachnoid), respiratory failure, renal failure, liver failure, septic shock and signs of gut ischemia.  I explained that the aggressive measures we are using have been ineffective and she is at too high risk to consider further invasive management.  The family voiced understanding.  I recommended no CPR and no escalation of care over the next 24 hours and reassessment of aggressive measures tomorrow.  They agreed.  We will discuss withdrawal of care tomorrow if no improvement.    Code status: Limited Code or DNR with short term  Disposition: Continue current acute care  Time spent for the meeting: 30 minutes  Max Fickle 04/18/2018, 10:48 AM

## 2018-04-18 NOTE — Progress Notes (Signed)
[facility] Progress Note  Name: Frances Le MRN: [id number] DOB: 10-Dec-1985  Subjective:  Maintained on CRRT overnight, paused for CT scan which showed SAH and she subsequently underwent CT angio which showed no aneurysms.  ADAMSTS 13 level returned in the ~30%s which is felt to be consistent with DIC not TTP.  Dire prognosis being discussed with family and they have agreed no escalation of care.  She may transition to CMO tomorrow.   Net ~1.0 L yesterday  Review of systems:  Unable to obtain 2/2 mech ventilation, sedated --------------- Background:  Frances Le is a 33 y.o. female with a history of IV drug use and bipolar disorder who presented to the hospital from jail with AMS.  The patient was found to have MSSA bacteremia and has been in septic shock and hypotensive requiring 3 pressors.  Dopamine stopped this AM due to tachycardia.  Of note, the patient also has been worked up for thrombocytopenia concerning for DIC or possible TTP.  TTP has been felt less likely per team.  Work-up notable for hepatitis C ab positive.  HIV non-reactive.  Urine pregnancy negative.  UA negative for protein and 0-5 RBC.  Consents have been obtained on emergent basis.  She has been incarcerated.      Intake/Output Summary (Last 24 hours) at 04/18/2018 1905 Last data filed at 04/18/2018 1900 Gross per 24 hour  Intake 4170.59 ml  Output 5395 ml  Net -1224.41 ml    Vitals:  Vitals:   04/18/18 1815 04/18/18 1830 04/18/18 1845 04/18/18 1900  BP:      Pulse: (!) 102 (!) 102 100 99  Resp: (!) 30 (!) 30 (!) 30 (!) 30  Temp: 98.1 F (36.7 C) 97.9 F (36.6 C) 97.9 F (36.6 C) 97.7 F (36.5 C)  TempSrc:      SpO2: 100% 100% 100% 100%  Weight:      Height:         Physical Exam:  General adult female in bed critically ill and intubated HEENT normocephalic atraumatic; ET tube in place with minimal oral mucosa bleeding Lungs coarse mechanical breath sounds Heart tachycardia   Abdomen soft nondistended; active bowel sounds Extremities 1+ edema  Neuro - intubated, sedated   Medications reviewed   Labs:  BMP Latest Ref Rng & Units 04/18/2018 04/18/2018 04/17/2018  Glucose 70 - 99 mg/dL 818(H) 631(S) 970(Y)  BUN 6 - 20 mg/dL 63(Z) 85(Y) 85(O)  Creatinine 0.44 - 1.00 mg/dL 2.77(A) 1.28(N) 8.67(E)  Sodium 135 - 145 mmol/L 137 135 136  Potassium 3.5 - 5.1 mmol/L 5.1 5.2(H) 5.5(H)  Chloride 98 - 111 mmol/L 104 102 103  CO2 22 - 32 mmol/L 22 19(L) 13(L)  Calcium 8.9 - 10.3 mg/dL 7.6(L) 7.6(L) 7.2(L)     Assessment/Plan:   # AKI  - Secondary to ischemic ATN as well as possible emboli in setting of MSSA bacteremia  - Continue CVVHD via non-tunneled line as supportive care but can hold if goals of care change - Set fluid removal goal as keep even with ARDS.    # Metabolic acidosis  -Improved with CRRT  # Septic shock secondary to MSSA bacteremia  - on cefazolin. Note complicated by diffuse pulmonary emboli   # Acute hypoxic and hypercapneic respiratory failure  - on mechanical ventilation per pulm  # Thrombocytopenia: studies consistent with DIC in setting of severe infection.   # IVDU complicated by MSSA bacteremia as above  # Hep C ab positive -  ID following   # Bipolar disorder - noted   Tyler Pita, MD 04/18/2018 7:05 PM

## 2018-04-18 NOTE — Progress Notes (Signed)
eLink Physician-Brief Progress Note Patient Name: Frances Le DOB: 12-20-1985 MRN: [id number]   Date of Service  04/18/2018  HPI/Events of Note  INR = 4.92.  eICU Interventions  Will transfuse 3 units FFP now.      Intervention Category Intermediate Interventions: Coagulopathy - evaluation and management  Sommer,Steven Eugene 04/18/2018, 12:59 AM

## 2018-04-18 NOTE — Progress Notes (Signed)
PCCM INTERVAL PROGRESS NOTE  Called mother Frances Le regarding new finding of subarachnoid hemorrhage. She is aware this is a significant change, and seems to understand that this finding solidifies Frances Le's poor prognosis. She is not prepared to make decisions regarding goals of care at this time, however, she is planning to come in around 9am to have this discussion.   Joneen Roach, AGACNP-BC [facility] Pulmonary/Critical Care Pager [phone] or [phone]  04/18/2018 2:39 AM

## 2018-04-18 NOTE — Progress Notes (Signed)
Upon assessment, noticed right pupil 70mm and left pupil 41mm, different than baseline. Notified MD Arsenio Loader, stat head CT order. Will continue to monitor.

## 2018-04-18 NOTE — Progress Notes (Signed)
eLink Physician-Brief Progress Note Patient Name: Frances Le DOB: 1985-05-22 MRN: [id number]   Date of Service  04/18/2018  HPI/Events of Note  Bilirubin = 21.0.  eICU Interventions  Will order: 1. Ammonia level now.      Intervention Category Major Interventions: Other:  Sommer,Steven Dennard Nip 04/18/2018, 5:35 AM

## 2018-04-18 NOTE — Progress Notes (Signed)
CRITICAL VALUE ALERT  Critical Value:  Hgb 6.1  Date & Time Notied:  04/18/18 1429  Provider Notified: Yes  Orders Received/Actions taken: None at this time, awaiting orders

## 2018-04-18 NOTE — Progress Notes (Signed)
Cold caloric test done with cold water with neurology at bedside. Patient did not respond with no nystagmus. Eyes remain fixed, pupils 8(right) & 7(left), edematous, and jaundice. Will continue to monitor closely. Huntley Estelle E, RN 04/18/2018 1:42 PM

## 2018-04-18 NOTE — Progress Notes (Signed)
   04/18/18 0700  Clinical Encounter Type  Visited With Health care provider;Patient and family together  Visit Type Initial;Spiritual support;Psychological support;Critical Care  Referral From Nurse  Spiritual Encounters  Spiritual Needs Prayer;Emotional  Stress Factors  Patient Stress Factors Not reviewed  Family Stress Factors Major life changes;Loss of control   Pg from RN for support for pt's aunt.  Met w/ pt's aunt bedside.  Per her report, aunt raised pt into her teens.  Aunt was distressed by concern for pt's poor prognosis and about "her soul."  Empathetic listening to aunt, then prayed w/ aunt at pt's bed.  Spoke w/ RN outside rm after.  Will report out to day chaplain in case needed, pls pg if so.  Myra Gianotti resident, [phone]

## 2018-04-18 NOTE — Progress Notes (Signed)
NAME:  Frances LikesBertha M Le, MRN:  [id number], DOB:  Oct 20, 1985, LOS: 4 ADMISSION DATE:  27-May-2017, CONSULTATION DATE:  27-May-2017 REFERRING MD:  [facility], CHIEF COMPLAINT:  Transfer for management of sepsis   Brief History   33 y/o female transferred for severe multi-organ failure in the setting of septic shock from staph bacteremia.   Past Medical History  Polysubstance abuse, bipolar disorder  Significant Hospital Events   12/29 intubated 12/30 CRRT 12/31 Subarachnoid hemorrhage  Consults:  ID Hematology  Procedures:  12/29 ETT 12/31 R PICC 12/30 HD cath   Significant Diagnostic Tests:  12/28 CT head > NACIP 12/29 CT chest/ab/pelvis> scattered airspace disease, R>L, likely diffuse septic emboli, underlying interstitial prominence, likely pulm edema, colonic wall thickening, mild diffuse mesenteric edema, haziness left renal pelvis/peri-nephric stranding, small ascites in pelvis 12/31 CT head > small volume subarachnoid hemorrhage medial R parieto-ocpital sulci greater than left parietal sulci 12/31 TTE> large tricuspid vegetation 1/1 CT angiogram> no intracranial aneurysm  Micro Data:  Blood cx March 06, 2018 x2/4 GPC  Urine cx March 06, 2018 GPC/GNR Blood 12/30 x2 >  Resp viral panel 12/30 >   Antimicrobials:  Vanc March 06, 2018>>> Ceftriaxone March 06, 2018>>>12/29 Azithromycine March 06, 2018>>>12/29 Zosyn 12/29>>>  Interim history/subjective:  Thrombocytopenic Last night developed pupil change> found to have subarachnoid hemorrhage Transfused blood and FFP yesteday Echo> large tricuspid valve vegetation  Objective   Blood pressure 124/78, pulse (!) 113, temperature (!) 97 F (36.1 C), resp. rate (!) 30, height 5\' 2"  (1.575 m), weight 60.9 kg, SpO2 100 %, unknown if currently breastfeeding. CVP:  [8 mmHg-15 mmHg] 8 mmHg  Vent Mode: PRVC FiO2 (%):  [40 %-100 %] 100 % Set Rate:  [30 bmp] 30 bmp Vt Set:  [400 mL] 400 mL PEEP:  [5 cmH20-12 cmH20] 5 cmH20 Plateau Pressure:   [17 cmH20-21 cmH20] 18 cmH20   Intake/Output Summary (Last 24 hours) at 04/18/2018 0724 Last data filed at 04/18/2018 0700 Gross per 24 hour  Intake 5124.67 ml  Output 4395 ml  Net 729.67 ml   Filed Weights   04/15/18 0500 04/16/18 0403 04/17/18 0500  Weight: 52 kg 58.6 kg 60.9 kg    Examination:  General:  In bed on vent HENT: NCAT ETT in place oral bleeding, diffuse oozing PULM: CTA B, vent supported breathing CV: RRR, slight murmur GI: BS+, soft, nontender DERM: diffuse anasarca, bruising over extensor surfaces Neuro: sedated on vent, pupils with minimal response on left, no response on right   Resolved Hospital Problem list     Assessment & Plan:  MSSA Bacteremia: presumably related to IV drug use, has tricuspid valve endocarditis, too unstable for any form of invasive intervention E Coli UTI Colonic inflammation on CT > continue cefazolin > f/u repeat blood cultures > maintain flagyl  Acute respiratory failure with hypoxemia: stable > full vent support, wean PEEP/FiO2 for goal PaO2 55-65, SaO2 > 90% > stop nimbex > VAP prevention protocol  Acute metabolic acidosis due to lactic acidosis Anasarca AKI > HD per renal > slight volume removal  Septic shock:  > wean off levophed for MAP > 65 > stop vasopressin if levophed < 4510mcg/min  DIC due to severe infection > hold further transfusion given grim prognosis  Subarachnoid hemorrhage > no intervention possible > treat sepsis/DIC as above  Prognosis:  She will not survive this illness. Further aggressive care will not be medically beneficial.  Will discuss goals of care with family today.  Best practice:  Diet: tube feeding Pain/Anxiety/Delirium protocol (if indicated):  change to PAD protocol, RASS goal -2,-3 VAP protocol (if indicated): yes DVT prophylaxis: SCD GI prophylaxis: continue protonix Glucose control: monitor closely Mobility: bed rest  Code Status: full Family Communication: updated aunt  Verdon Cummins again on 1/1, plan to meet with mother later today Disposition: remain in ICU  Labs   CBC: Recent Labs  Lab 2018-04-20 1425 04/20/2018 2333  04/16/18 0050 04/16/18 0342 04/17/18 0340 04/17/18 1109 04/17/18 2331  WBC 36.4* 35.0*   < > 33.3* 58.8* 66.6* 63.4* 49.4*  NEUTROABS 32.3* 30.1*  --   --   --  50.8* 48.8* 43.0*  HGB 13.0 10.4*   < > 9.2* 8.1* 6.8* 8.4* 6.6*  HCT 39.8 31.2*   < > 28.5* 25.9* 22.6* 26.3* 21.1*  MCV 85.8 85.7   < > 90.8 92.2 95.0 95.3 93.8  PLT 8* 7*   < > 7* 56* 10* 9*  8* 9*   < > = values in this interval not displayed.    Basic Metabolic Panel: Recent Labs  Lab 04/15/18 2222 04/16/18 0050 04/16/18 0342 04/16/18 1502 04/16/18 1600 04/17/18 0340 04/17/18 1524 04/18/18 0419  NA  --  141 141 142  --  139 136 135  K  --  4.3 3.7 5.5*  --  5.2* 5.5* 5.2*  CL  --  114* 113* 109  --  106 103 102  CO2  --  16* 15* 18*  --  16* 13* 19*  GLUCOSE  --  93 70 101*  --  130* 137* 113*  BUN  --  76* 77* 80*  --  63* 40* 39*  CREATININE  --  1.60* 1.92* 1.89*  --  1.68* 1.39* 1.44*  CALCIUM  --  7.4* 7.4* 7.3*  --  6.9* 7.2* 7.6*  MG 2.5* 2.5*  --   --  2.6* 2.6*  --  2.7*  PHOS 4.8* 5.4*  --   --  10.6* 8.4* 7.4* 6.6*   GFR: Estimated Creatinine Clearance: 49.1 mL/min (A) (by C-G formula based on SCr of 1.44 mg/dL (H)). Recent Labs  Lab 2018/04/20 1425  April 20, 2018 2333 04/15/18 0250  04/16/18 0342 04/16/18 1351 04/17/18 0340 04/17/18 1102 04/17/18 1109 04/17/18 2331  PROCALCITON 16.86  --  9.42  --   --   --   --   --   --   --   --   WBC 36.4*  --  35.0*  --    < > 58.8*  --  66.6*  --  63.4* 49.4*  LATICACIDVEN  --    < > 3.9* 3.8*  --   --  7.3*  --  11.3*  --   --    < > = values in this interval not displayed.    Liver Function Tests: Recent Labs  Lab Apr 20, 2018 1425 2018/04/20 2333 04/16/18 0342 04/17/18 0340 04/17/18 1524 04/18/18 0419  AST 53* 52* 68*  --   --  4,900*  ALT 35 29 23  --   --  627*  ALKPHOS 139* 129* 247*  --   --   500*  BILITOT 6.8* 5.7* 7.6*  --   --  21.0*  PROT 5.8* 4.6* 4.6*  --   --  6.0*  ALBUMIN 2.1* 1.5* 1.4* 1.8* 1.9* 2.2*   Recent Labs  Lab 04-20-18 2333  LIPASE 17  AMYLASE 8*   Recent Labs  Lab 04/20/2018 1425 04/18/18 0642  AMMONIA 34 175*    ABG    Component  Value Date/Time   PHART 7.260 (L) 04/17/2018 2337   PCO2ART 42.8 04/17/2018 2337   PO2ART 68.0 (L) 04/17/2018 2337   HCO3 19.1 (L) 04/17/2018 2337   TCO2 20 (L) 04/17/2018 2337   ACIDBASEDEF 7.0 (H) 04/17/2018 2337   O2SAT 90.0 04/17/2018 2337     Coagulation Profile: Recent Labs  Lab 03-07-2018 1425 03-07-2018 2333 04/15/18 1311 04/17/18 1109 04/17/18 2331  INR 1.30 1.45 1.61 6.26* 4.92*    Cardiac Enzymes: Recent Labs  Lab 03-07-2018 2333 04/15/18 0250 04/15/18 0820  TROPONINI 0.15* 0.14* 0.12*    HbA1C: No results found for: HGBA1C  CBG: Recent Labs  Lab 04/17/18 1129 04/17/18 1520 04/17/18 2009 04/17/18 2334 04/18/18 0418  GLUCAP 101* 130* 157* 138* 99     Critical care time: 35 minutes    Heber CarolinaBrent , MD [place] [facility] Pager: [phone] Cell: [phone] If no response, call [phone]

## 2018-04-18 DEATH — deceased

## 2018-04-19 DIAGNOSIS — J9601 Acute respiratory failure with hypoxia: Secondary | ICD-10-CM

## 2018-04-19 LAB — BPAM RBC
BLOOD PRODUCT EXPIRATION DATE: 202001072359
Blood Product Expiration Date: 202001072359
ISSUE DATE / TIME: 202001010347
ISSUE DATE / TIME: 202001011540
Unit Type and Rh: 9500
Unit Type and Rh: 9500

## 2018-04-19 LAB — GLUCOSE, CAPILLARY
Glucose-Capillary: 98 mg/dL (ref 70–99)
Glucose-Capillary: 95 mg/dL (ref 70–99)
Glucose-Capillary: 116 mg/dL — ABNORMAL HIGH (ref 70–99)

## 2018-04-19 LAB — RENAL FUNCTION PANEL
Albumin: 2 g/dL — ABNORMAL LOW (ref 3.5–5.0)
Anion gap: 8 (ref 5–15)
BUN: 29 mg/dL — ABNORMAL HIGH (ref 6–20)
CALCIUM: 7.7 mg/dL — AB (ref 8.9–10.3)
CO2: 24 mmol/L (ref 22–32)
Chloride: 103 mmol/L (ref 98–111)
Creatinine, Ser: 1.05 mg/dL — ABNORMAL HIGH (ref 0.44–1.00)
Glucose, Bld: 104 mg/dL — ABNORMAL HIGH (ref 70–99)
Phosphorus: 3.1 mg/dL (ref 2.5–4.6)
Potassium: 4.8 mmol/L (ref 3.5–5.1)
Sodium: 135 mmol/L (ref 135–145)

## 2018-04-19 LAB — BPAM PLATELET PHERESIS
BLOOD PRODUCT EXPIRATION DATE: 202001022359
Blood Product Expiration Date: 202001032359
ISSUE DATE / TIME: 202001010841
ISSUE DATE / TIME: 202001010950
Unit Type and Rh: 5100
Unit Type and Rh: 9500

## 2018-04-19 LAB — PREPARE FRESH FROZEN PLASMA
Unit division: 0
Unit division: 0
Unit division: 0

## 2018-04-19 LAB — CBC WITH DIFFERENTIAL/PLATELET
Abs Immature Granulocytes: 2.32 10*3/uL — ABNORMAL HIGH (ref 0.00–0.07)
Basophils Absolute: 0.1 10*3/uL (ref 0.0–0.1)
Basophils Relative: 0 %
Eosinophils Absolute: 0 10*3/uL (ref 0.0–0.5)
Eosinophils Relative: 0 %
HCT: 23.1 % — ABNORMAL LOW (ref 36.0–46.0)
Hemoglobin: 7.6 g/dL — ABNORMAL LOW (ref 12.0–15.0)
Immature Granulocytes: 8 %
Lymphocytes Relative: 5 %
Lymphs Abs: 1.5 10*3/uL (ref 0.7–4.0)
MCH: 27.8 pg (ref 26.0–34.0)
MCHC: 32.9 g/dL (ref 30.0–36.0)
MCV: 84.6 fL (ref 80.0–100.0)
Monocytes Absolute: 0.8 10*3/uL (ref 0.1–1.0)
Monocytes Relative: 3 %
Neutro Abs: 23.8 10*3/uL — ABNORMAL HIGH (ref 1.7–7.7)
Neutrophils Relative %: 84 %
Platelets: 30 10*3/uL — ABNORMAL LOW (ref 150–400)
RBC: 2.73 MIL/uL — ABNORMAL LOW (ref 3.87–5.11)
RDW: 17 % — ABNORMAL HIGH (ref 11.5–15.5)
WBC: 28.5 10*3/uL — AB (ref 4.0–10.5)
nRBC: 7.2 % — ABNORMAL HIGH (ref 0.0–0.2)

## 2018-04-19 LAB — TYPE AND SCREEN
ABO/RH(D): O NEG
Antibody Screen: POSITIVE
Unit division: 0
Unit division: 0

## 2018-04-19 LAB — BPAM FFP
BLOOD PRODUCT EXPIRATION DATE: 202001052359
Blood Product Expiration Date: 202001052359
Blood Product Expiration Date: 202001052359
ISSUE DATE / TIME: 202001010218
ISSUE DATE / TIME: 202001010218
ISSUE DATE / TIME: 202001010218
UNIT TYPE AND RH: 5100
Unit Type and Rh: 600
Unit Type and Rh: 600

## 2018-04-19 LAB — PREPARE PLATELET PHERESIS
Unit division: 0
Unit division: 0

## 2018-04-19 LAB — MAGNESIUM: Magnesium: 2.6 mg/dL — ABNORMAL HIGH (ref 1.7–2.4)

## 2018-04-19 MED ORDER — GLYCOPYRROLATE 0.2 MG/ML IJ SOLN: 0.2000 mg | INTRAMUSCULAR | Status: DC | PRN

## 2018-04-19 MED ORDER — DIPHENHYDRAMINE HCL 50 MG/ML IJ SOLN: 25.0000 mg | INTRAMUSCULAR | Status: DC | PRN

## 2018-04-19 MED ORDER — MORPHINE BOLUS VIA INFUSION
5.0000 mg | INTRAVENOUS | Status: DC | PRN
  Filled 2018-04-19: qty 5

## 2018-04-19 MED ORDER — ACETAMINOPHEN 325 MG PO TABS: 650.0000 mg | ORAL_TABLET | Freq: Four times a day (QID) | ORAL | Status: DC | PRN

## 2018-04-19 MED ORDER — GLYCOPYRROLATE 1 MG PO TABS: 1.0000 mg | ORAL_TABLET | ORAL | Status: DC | PRN

## 2018-04-19 MED ORDER — MORPHINE SULFATE (PF) 2 MG/ML IV SOLN: 2.0000 mg | INTRAVENOUS | Status: DC | PRN

## 2018-04-19 MED ORDER — MORPHINE 100MG IN NS 100ML (1MG/ML) PREMIX INFUSION
0.0000 mg/h | INTRAVENOUS | Status: DC
  Administered 2018-04-19: 5 mg/h via INTRAVENOUS
  Filled 2018-04-19: qty 100

## 2018-04-19 MED ORDER — POLYVINYL ALCOHOL 1.4 % OP SOLN
1.0000 [drp] | Freq: Four times a day (QID) | OPHTHALMIC | Status: DC | PRN
  Filled 2018-04-19: qty 15

## 2018-04-19 MED ORDER — DEXTROSE 5 % IV SOLN: INTRAVENOUS | Status: DC

## 2018-04-19 MED ORDER — ACETAMINOPHEN 650 MG RE SUPP: 650.0000 mg | Freq: Four times a day (QID) | RECTAL | Status: DC | PRN

## 2018-04-20 LAB — CULTURE, BLOOD (ROUTINE X 2)
Culture: NO GROWTH
Culture: NO GROWTH
Special Requests: ADEQUATE

## 2018-04-21 LAB — CULTURE, BLOOD (ROUTINE X 2)
Culture: NO GROWTH
Culture: NO GROWTH
Special Requests: ADEQUATE
Special Requests: ADEQUATE

## 2018-05-19 NOTE — Progress Notes (Signed)
NAME:  Frances Le, MRN:  [id number], DOB:  [birth year], LOS: 5 ADMISSION DATE:  2017/08/30, CONSULTATION DATE:  2017/08/30 REFERRING MD:  [facility], CHIEF COMPLAINT:  Transfer for management of sepsis   Brief History   33 y/o female transferred for severe multi-organ failure in the setting of septic shock from staph bacteremia.   Past Medical History  Polysubstance abuse, bipolar disorder  Significant Hospital Events   12/29 intubated 12/30 CRRT 12/31 Subarachnoid hemorrhage  Consults:  ID Hematology  Procedures:  12/29 ETT 12/31 R PICC 12/30 HD cath   Significant Diagnostic Tests:  12/28 CT head > NACIP 12/29 CT chest/ab/pelvis> scattered airspace disease, R>L, likely diffuse septic emboli, underlying interstitial prominence, likely pulm edema, colonic wall thickening, mild diffuse mesenteric edema, haziness left renal pelvis/peri-nephric stranding, small ascites in pelvis 12/31 CT head > small volume subarachnoid hemorrhage medial R parieto-ocpital sulci greater than left parietal sulci 12/31 TTE> large tricuspid vegetation 1/1 CT angiogram> no intracranial aneurysm  Micro Data:  Blood cx Jan 23, 2018 x2/4 GPC  Urine cx Jan 23, 2018 GPC/GNR Blood 12/30 x2 >  Resp viral panel 12/30 >   Antimicrobials:  Vanc Jan 23, 2018>>> Ceftriaxone Jan 23, 2018>>>12/29 Azithromycine Jan 23, 2018>>>12/29 Zosyn 12/29>>>  Interim history/subjective:   Sedation held No response to pain, no gag  Objective   Blood pressure (!) 95/42, pulse (!) 105, temperature 99.3 F (37.4 C), resp. rate (!) 30, height 5\' 2"  (1.575 m), weight 59.7 kg, SpO2 99 %, unknown if currently breastfeeding.    Vent Mode: PRVC FiO2 (%):  [40 %-100 %] 40 % Set Rate:  [30 bmp] 30 bmp Vt Set:  [400 mL] 400 mL PEEP:  [5 cmH20] 5 cmH20 Plateau Pressure:  [16 cmH20-22 cmH20] 19 cmH20   Intake/Output Summary (Last 24 hours) at 05/06/2018 0813 Last data filed at 04/22/2018 0800 Gross per 24 hour  Intake 2402.57 ml    Output 4253 ml  Net -1850.43 ml   Filed Weights   04/16/18 0403 04/17/18 0500 05/15/2018 0500  Weight: 58.6 kg 60.9 kg 59.7 kg    Examination:  General:  In bed on vent HENT: NCAT ETT in place PULM: CTA B, vent supported breathing CV: RRR, no mgr GI: BS+, soft, nontender MSK: normal bulk and tone Neuro: GCS 3, no pupillary light response, no doll's eyes, no gag, no response to pain    Resolved Hospital Problem list     Assessment & Plan:  MSSA Bacteremia: presumably related to IV drug use, has tricuspid valve endocarditis, too unstable for any form of invasive intervention E Coli UTI Colonic inflammation on CT Acute respiratory failure with hypoxemia Acute metabolic acidosis due to lactic acidosis Anasarca AKI Septic shock:  DIC due to severe infection Subarachnoid hemorrhage Severe anoxic brain injury: exam on 1/2 worrisome for brain death; Would need apnea test to formalize but I don't think patient's family will want this.  Plan: Continue to hold sedation Plan to withdraw care after family arrives today Do not escalate care Continue full mechanical vent support and vasopressors until they arrive  Prognosis:  Poor  Best practice:  Diet: tube feeding Pain/Anxiety/Delirium protocol (if indicated): none VAP protocol (if indicated): yes DVT prophylaxis: SCD GI prophylaxis: continue protonix Glucose control: stop checking Mobility: bed rest  Code Status: full Family Communication: lengthy conversation with family on 1/2 Disposition: remain in ICU  Labs   CBC: Recent Labs  Lab 04/17/18 1109 04/17/18 2331 04/18/18 0419 04/18/18 1330 04/18/18 1804 05/09/2018 0531  WBC 63.4* 49.4* 43.2* 40.1* 36.6*  28.5*  NEUTROABS 48.8* 43.0* 39.7* 36.5*  --  23.8*  HGB 8.4* 6.6* 7.1* 6.1* 7.9* 7.6*  HCT 26.3* 21.1* 22.8* 19.3* 24.4* 23.1*  MCV 95.3 93.8 89.1 88.1 87.8 84.6  PLT 9*  8* 9* 8* 67* 44* 30*    Basic Metabolic Panel: Recent Labs  Lab 04/16/18 0050   04/16/18 1600 04/17/18 0340 04/17/18 1524 04/18/18 0419 04/18/18 1600 04/29/2018 0531  NA 141   < >  --  139 136 135 137 135  K 4.3   < >  --  5.2* 5.5* 5.2* 5.1 4.8  CL 114*   < >  --  106 103 102 104 103  CO2 16*   < >  --  16* 13* 19* 22 24  GLUCOSE 93   < >  --  130* 137* 113* 143* 104*  BUN 76*   < >  --  63* 40* 39* 37* 29*  CREATININE 1.60*   < >  --  1.68* 1.39* 1.44* 1.13* 1.05*  CALCIUM 7.4*   < >  --  6.9* 7.2* 7.6* 7.6* 7.7*  MG 2.5*  --  2.6* 2.6*  --  2.7*  --  2.6*  PHOS 5.4*  --  10.6* 8.4* 7.4* 6.6* 6.3* 3.1   < > = values in this interval not displayed.   GFR: Estimated Creatinine Clearance: 62 mL/min (A) (by C-G formula based on SCr of 1.05 mg/dL (H)). Recent Labs  Lab 2018-04-22 1425  April 22, 2018 2333 04/15/18 0250  04/16/18 1351  04/17/18 1102  04/18/18 0419 04/18/18 1330 04/18/18 1804 05/13/2018 0531  PROCALCITON 16.86  --  9.42  --   --   --   --   --   --   --   --   --   --   WBC 36.4*  --  35.0*  --    < >  --    < >  --    < > 43.2* 40.1* 36.6* 28.5*  LATICACIDVEN  --    < > 3.9* 3.8*  --  7.3*  --  11.3*  --   --   --   --   --    < > = values in this interval not displayed.    Liver Function Tests: Recent Labs  Lab April 22, 2018 1425 22-Apr-2018 2333 04/16/18 0342 04/17/18 0340 04/17/18 1524 04/18/18 0419 04/18/18 1600 04/20/2018 0531  AST 53* 52* 68*  --   --  4,900*  --   --   ALT 35 29 23  --   --  627*  --   --   ALKPHOS 139* 129* 247*  --   --  500*  --   --   BILITOT 6.8* 5.7* 7.6*  --   --  21.0*  --   --   PROT 5.8* 4.6* 4.6*  --   --  6.0*  --   --   ALBUMIN 2.1* 1.5* 1.4* 1.8* 1.9* 2.2* 2.3* 2.0*   Recent Labs  Lab 04-22-2018 2333  LIPASE 17  AMYLASE 8*   Recent Labs  Lab 04/22/18 1425 04/18/18 0642  AMMONIA 34 175*    ABG    Component Value Date/Time   PHART 7.260 (L) 04/17/2018 2337   PCO2ART 42.8 04/17/2018 2337   PO2ART 68.0 (L) 04/17/2018 2337   HCO3 19.1 (L) 04/17/2018 2337   TCO2 20 (L) 04/17/2018 2337   ACIDBASEDEF  7.0 (H) 04/17/2018 2337   O2SAT 90.0 04/17/2018  2337     Coagulation Profile: Recent Labs  Lab 03/30/2018 1425 03/25/2018 2333 04/15/18 1311 04/17/18 1109 04/17/18 2331  INR 1.30 1.45 1.61 6.26* 4.92*    Cardiac Enzymes: Recent Labs  Lab 03/20/2018 2333 04/15/18 0250 04/15/18 0820  TROPONINI 0.15* 0.14* 0.12*    HbA1C: No results found for: HGBA1C  CBG: Recent Labs  Lab 04/18/18 1523 04/18/18 1924 04/18/18 2308 04-29-18 0317 04-29-2018 0712  GLUCAP 196* 109* 116* 98 95     Critical care time: 30 minutes    Heber [place], MD [facility] Pager: [phone] Cell: [phone] If no response, call [phone]

## 2018-05-19 NOTE — Procedures (Signed)
Extubation Procedure Note  Patient Details:   Name: Frances Le DOB: [birth year] MRN: [id number]   Airway Documentation:    Vent end date: 04-25-2018 Vent end time: 1243   Evaluation  O2 sats: transiently fell during during procedure Complications: No apparent complications Patient did not tolerate procedure well. Bilateral Breath Sounds: Clear, Diminished   No.  Pt extubated to room air per comfort care order. RN and family at bedside.   [name] 04/25/2018, 12:55 PM

## 2018-05-19 NOTE — Plan of Care (Signed)
Note reviewed. Pt is in comfort care measures. Will sign off. Call with further questions.   Marvel Plan, MD PhD Stroke Neurology May 19, 2018 12:43 PM

## 2018-05-19 NOTE — Progress Notes (Signed)
[facility]  I met with the patient's family and discussed the neuro exam which is strongly suggestive of brain death.  They wish to withdraw care.  Will proceed.  Roselie Awkward, MD [facility] Pager: [phone] Cell: [phone] If no response, call [phone]

## 2018-05-19 NOTE — Death Summary Note (Signed)
DEATH SUMMARY   Patient Details  Name: Frances Le MRN: [id number] DOB: [birth year]  Admission/Discharge Information   Admit Date:  04/06/2018  Date of Death: Date of Death: 02/18/19  Time of Death: Time of Death: 1248  Length of Stay: 5  [facility]   Reason(s) for Hospitalization  Confusion  Diagnoses  Preliminary cause of death:   MSSA Tricuscpid valve endocarditis Secondary Diagnoses (including complications and co-morbidities):  Active Problems:   T.T.P. syndrome (HCC)   Sepsis (HCC)   Septic shock (HCC)   AKI (acute kidney injury) (HCC)   Community acquired pneumonia   Drug withdrawal (HCC)   Encephalopathy   ARDS (adult respiratory distress syndrome) (HCC)   History of ETT   Hypoxia   Thrombocytopenia (HCC)   Cerebral embolism with cerebral infarction   Acute respiratory failure with hypoxemia (HCC)   SAH (subarachnoid hemorrhage) (HCC)   Acute bacterial endocarditis   Brief Hospital Course (including significant findings, care, treatment, and services provided and events leading to death)  Frances Le is a 33 y.o. year old female who was admitted to the hospital after she had been found encephalopathic.  Not long after the admission she was found to have worsening respiratory failure with hypoxemia and worsening encephalopathy.  In the ICU she developed severe septic shock, acute kidney injury, acute respiratory failure with hypoxemia, and severe thrombocytopenia.  Renal was consulted and CVVHD was performed.  Because of her thrombocytopenia hematology was consulted.  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  She was given blood and platelet and plasma replacement on many separate occasions.  Unfortunately after several days of aggressive care her condition continued to worsen.  Her mental status declined and there was a change in her pupillary light response so a CT scan was performed which showed a  subarachnoid hemorrhage.  Unfortunately her neurologic condition continues to decline.  We discussed her overall poor prognosis with her family and they elected to withdraw care.    Pertinent Labs and Studies  Significant Diagnostic Studies Ct Abdomen Pelvis Wo Contrast  Result Date: 04/15/2018 CLINICAL DATA:  Acute onset of sepsis. EXAM: CT CHEST, ABDOMEN AND PELVIS WITHOUT CONTRAST TECHNIQUE: Multidetector CT imaging of the chest, abdomen and pelvis was performed following the standard protocol without IV contrast. COMPARISON:  CT of the abdomen and pelvis performed 11/26/2017, and chest radiograph performed 04/06/2018 FINDINGS: CT CHEST FINDINGS Cardiovascular: The heart is normal in size. The thoracic aorta is grossly unremarkable, though difficult to fully assess without contrast. The great vessels are unremarkable in appearance. Mediastinum/Nodes: The mediastinum is unremarkable in appearance. No definite mediastinal lymphadenopathy is seen. No pericardial effusion is identified. The thyroid gland is grossly unremarkable. No axillary lymphadenopathy is appreciated. Lungs/Pleura: Scattered nodular airspace opacities are noted bilaterally, with larger airspace opacities on the right, measuring up to 5.6 cm in size. This is suspicious for septic emboli. Underlying diffuse interstitial prominence is noted, with trace bilateral pleural fluid, concerning for mild pulmonary edema. No pneumothorax is seen. Musculoskeletal: No acute osseous abnormalities are identified. The visualized musculature is unremarkable in appearance. CT ABDOMEN PELVIS FINDINGS Hepatobiliary: The liver is unremarkable in appearance. The gallbladder is unremarkable in appearance. The common bile duct remains normal in caliber. Pancreas: The pancreas is within normal limits. Spleen: The spleen is unremarkable in appearance. Adrenals/Urinary Tract: The adrenal glands are unremarkable in appearance. There is question of haziness about the  left renal pelvis, and mild left-sided perinephric stranding  is seen. There is no evidence of hydronephrosis. No renal or ureteral stones are identified. Stomach/Bowel: The stomach is unremarkable in appearance. The small bowel is within normal limits. The appendix is normal in caliber, without evidence of appendicitis. There is question of mild wall thickening at the cecum and proximal ascending colon, which could reflect a mild infectious or inflammatory process. Mild diffuse mesenteric edema is noted. Vascular/Lymphatic: The abdominal aorta is grossly unremarkable, though not well assessed without contrast. No definite focal venous abnormalities are characterized, though the venous structures are also not well assessed. No definite retroperitoneal or pelvic sidewall lymphadenopathy is seen. Reproductive: The bladder is mildly distended, and contains air, secondary to Foley catheter placement. The uterus is grossly unremarkable in appearance. No suspicious adnexal masses are seen. Other: A small amount of ascites is seen within the pelvis. Musculoskeletal: No acute osseous abnormalities are identified. The visualized musculature is unremarkable in appearance. IMPRESSION: 1. Scattered nodular airspace opacities bilaterally, with larger airspace opacities on the right, measuring up to 5.6 cm in size. This is suspicious for diffuse septic emboli. 2. Underlying diffuse interstitial prominence, with trace bilateral pleural fluid, concerning for mild pulmonary edema. 3. Question of mild wall thickening at the cecum and proximal ascending colon, which could reflect a mild infectious or inflammatory process. 4. Mild diffuse mesenteric edema noted. 5. Question of haziness about the left renal pelvis, and mild left-sided perinephric stranding. Would correlate for any evidence of left-sided pyelonephritis. 6. Small amount of ascites within the pelvis. Electronically Signed   By: Roanna Raider M.D.   On: 04/15/2018 01:01    Ct Angio Head W Or Wo Contrast  Result Date: 04/18/2018 CLINICAL DATA:  Subarachnoid hemorrhage EXAM: CT ANGIOGRAPHY HEAD TECHNIQUE: Multidetector CT imaging of the head was performed using the standard protocol during bolus administration of intravenous contrast. Multiplanar CT image reconstructions and MIPs were obtained to evaluate the vascular anatomy. CONTRAST:  ISOVUE-370 IOPAMIDOL (ISOVUE-370) INJECTION 76% COMPARISON:  Head CT 04/17/2018 FINDINGS: CTA HEAD FINDINGS ANTERIOR CIRCULATION: --Intracranial internal carotid arteries: Normal. --Anterior cerebral arteries: Normal. Both A1 segments are present. Patent anterior communicating artery. --Middle cerebral arteries: Normal. --Posterior communicating arteries: Absent bilaterally. POSTERIOR CIRCULATION: --Basilar artery: Normal. --Posterior cerebral arteries: Normal. --Superior cerebellar arteries: Normal. --Inferior cerebellar arteries: Normal anterior and posterior inferior cerebellar arteries. VENOUS SINUSES: As permitted by contrast timing, patent. ANATOMIC VARIANTS: None DELAYED PHASE: No parenchymal contrast enhancement. Review of the MIP images confirms the above findings. IMPRESSION: No intracranial aneurysm, occlusion or hemodynamically significant stenosis. Electronically Signed   By: Deatra Robinson M.D.   On: 04/18/2018 02:02   Dg Chest 1 View  Addendum Date: 04/15/2018   ADDENDUM REPORT: 04/15/2018 16:12 ADDENDUM: Right IJ central line catheter terminates in the mid SVC. No pneumothorax. Electronically Signed   By: Tollie Eth M.D.   On: 04/15/2018 16:12   Result Date: 04/15/2018 CLINICAL DATA:  Right-sided central line placement and endotracheal tube assessment. Substance abuse. EXAM: CHEST  1 VIEW COMPARISON:  None. FINDINGS: The patient is tilted to the left. Endotracheal tube tip is 2.7 cm above the carina. Gastric tube extends below the left hemidiaphragm though the tip and side port are excluded and not visualized on this  study. Interval increase in pulmonary vascular congestion, left greater than right. Nodular masslike opacities project over the right mid and lower lobes suspicious for possible septic emboli given history of substance abuse. No acute osseous abnormality. IMPRESSION: 1. Endotracheal tube tip is 2.7 cm above the carina.  2. Gastric tube extends below the left hemidiaphragm though the tip and side port are excluded on this study. 3. Nodular masslike opacities project over the right mid and lower lobes, suspicious for possible septic emboli given history of substance abuse. 4. Interval increase in pulmonary vascular congestion more left-sided. Asymmetric pulmonary edema due to patient positioning might account this appearance. Electronically Signed: By: Tollie Eth M.D. On: 04/15/2018 15:18   Dg Chest 1 View  Result Date: 03/18/2018 CLINICAL DATA:  Sepsis. Heroin withdrawal. EXAM: CHEST  1 VIEW COMPARISON:  11/26/2017 FINDINGS: Normal heart size and mediastinal contours. Confluent opacities are noted in both upper lobes and to a greater extent in the right lower lobe without cavitation. Multilobar pneumonia might account for this or potentially a septic emboli without cavitation. No effusion or edema. No bone destruction or fracture. IMPRESSION: Confluent opacities in both upper lobes and to a greater extent in the right lower lobe without cavitation. Multilobar pneumonia might account for this or potentially non cavitary septic emboli given patient history. Electronically Signed   By: Tollie Eth M.D.   On: 04/09/2018 15:30   Ct Head Wo Contrast  Result Date: 04/17/2018 CLINICAL DATA:  Inpatient. Altered level of consciousness. Anisocoria. Endocarditis. EXAM: CT HEAD WITHOUT CONTRAST TECHNIQUE: Contiguous axial images were obtained from the base of the skull through the vertex without intravenous contrast. COMPARISON:  04/12/2018 head CT FINDINGS: Brain: There is a small volume of acute subarachnoid  hemorrhage within the medial right parieto-occipital sulci greater than superior left parietal sulci. No evidence of parenchymal hemorrhage. No mass lesion, mass effect, or midline shift. No CT evidence of acute infarction. Cerebral volume is age appropriate. No ventriculomegaly. Vascular: No appreciable acute abnormality. Skull: No evidence of calvarial fracture. Sinuses/Orbits: The visualized paranasal sinuses are essentially clear. Other: Partial bilateral mastoid effusions. IMPRESSION: 1. Small volume acute subarachnoid hemorrhage within the medial right parieto-occipital sulci greater than superior left parietal sulci. No mass effect or midline shift. No hydrocephalus. 2. Partial bilateral mastoid effusions. Critical Value/emergent results were called by telephone at the time of interpretation on 04/17/2018 at 11:00 pm to Dr. Loletha Grayer , who verbally acknowledged these results. Electronically Signed   By: Delbert Phenix M.D.   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 Altered mental status EXAM: CT HEAD WITHOUT CONTRAST TECHNIQUE: Contiguous axial images were obtained from the base of the skull through the vertex without intravenous contrast. COMPARISON:  None. FINDINGS: Brain: Ventricles and sulci are appropriate for patient's age. No evidence for acute cortically based infarct, intracranial hemorrhage, mass lesion or mass-effect. Vascular: Unremarkable Skull: Intact Sinuses/Orbits: Paranasal sinuses are well aerated. Mastoid air cells unremarkable. Orbits are unremarkable. Other: None. IMPRESSION: No acute intracranial process. Electronically Signed   By: Annia Belt M.D.   On: 03/19/2018 15:21   Ct Chest Wo Contrast  Result Date: 04/15/2018 CLINICAL DATA:  Acute onset of sepsis. EXAM: CT CHEST, ABDOMEN AND PELVIS WITHOUT CONTRAST TECHNIQUE: Multidetector CT imaging of the chest, abdomen and pelvis was performed following the  standard protocol without IV contrast. COMPARISON:  CT of the abdomen and pelvis performed 11/26/2017, and chest radiograph performed 04/11/2018 FINDINGS: CT CHEST FINDINGS Cardiovascular: The heart is normal in size. The thoracic aorta is grossly unremarkable, though difficult to fully assess without contrast. The great vessels are unremarkable in appearance. Mediastinum/Nodes: The mediastinum is unremarkable in appearance. No definite mediastinal lymphadenopathy is seen. No pericardial  effusion is identified. The thyroid gland is grossly unremarkable. No axillary lymphadenopathy is appreciated. Lungs/Pleura: Scattered nodular airspace opacities are noted bilaterally, with larger airspace opacities on the right, measuring up to 5.6 cm in size. This is suspicious for septic emboli. Underlying diffuse interstitial prominence is noted, with trace bilateral pleural fluid, concerning for mild pulmonary edema. No pneumothorax is seen. Musculoskeletal: No acute osseous abnormalities are identified. The visualized musculature is unremarkable in appearance. CT ABDOMEN PELVIS FINDINGS Hepatobiliary: The liver is unremarkable in appearance. The gallbladder is unremarkable in appearance. The common bile duct remains normal in caliber. Pancreas: The pancreas is within normal limits. Spleen: The spleen is unremarkable in appearance. Adrenals/Urinary Tract: The adrenal glands are unremarkable in appearance. There is question of haziness about the left renal pelvis, and mild left-sided perinephric stranding is seen. There is no evidence of hydronephrosis. No renal or ureteral stones are identified. Stomach/Bowel: The stomach is unremarkable in appearance. The small bowel is within normal limits. The appendix is normal in caliber, without evidence of appendicitis. There is question of mild wall thickening at the cecum and proximal ascending colon, which could reflect a mild infectious or inflammatory process. Mild diffuse  mesenteric edema is noted. Vascular/Lymphatic: The abdominal aorta is grossly unremarkable, though not well assessed without contrast. No definite focal venous abnormalities are characterized, though the venous structures are also not well assessed. No definite retroperitoneal or pelvic sidewall lymphadenopathy is seen. Reproductive: The bladder is mildly distended, and contains air, secondary to Foley catheter placement. The uterus is grossly unremarkable in appearance. No suspicious adnexal masses are seen. Other: A small amount of ascites is seen within the pelvis. Musculoskeletal: No acute osseous abnormalities are identified. The visualized musculature is unremarkable in appearance. IMPRESSION: 1. Scattered nodular airspace opacities bilaterally, with larger airspace opacities on the right, measuring up to 5.6 cm in size. This is suspicious for diffuse septic emboli. 2. Underlying diffuse interstitial prominence, with trace bilateral pleural fluid, concerning for mild pulmonary edema. 3. Question of mild wall thickening at the cecum and proximal ascending colon, which could reflect a mild infectious or inflammatory process. 4. Mild diffuse mesenteric edema noted. 5. Question of haziness about the left renal pelvis, and mild left-sided perinephric stranding. Would correlate for any evidence of left-sided pyelonephritis. 6. Small amount of ascites within the pelvis. Electronically Signed   By: Roanna Raider M.D.   On: 04/15/2018 01:01   Dg Chest Port 1 View  Result Date: 04/18/2018 CLINICAL DATA:  Acute respiratory failure. EXAM: PORTABLE CHEST 1 VIEW COMPARISON:  04/17/2018 FINDINGS: Endotracheal tube terminates 2.7 cm above the carina. Left jugular catheter terminates over the lower SVC. A right PICC has been placed and appears to terminates well within the right atrium although its tip is poorly delineated due to superimposition of the spine and mediastinal structures resulting in mild under penetration of  this region. The cardiac silhouette is normal in size. Large, rounded area of consolidation in the right lung base is stable to slightly increased. Rounded lucent areas along its superior margin are slightly more conspicuous, and there is also increasing patchy opacity in the right upper lobe with a newly apparent rounded lucency. Patchy and hazy opacity throughout the left lung has increased. No large pleural effusion or pneumothorax is identified. IMPRESSION: 1. Interval right PICC placement. Tip likely deep in the right atrium. 2. Worsening appearance of the lungs bilaterally. Asymmetric consolidation with new/increasing cavitary lesions in the right lung likely reflecting septic emboli. Suspected superimposed pulmonary  edema. Electronically Signed   By: Sebastian AcheAllen  Grady M.D.   On: 04/18/2018 07:21   Dg Chest Port 1 View  Result Date: 04/17/2018 CLINICAL DATA:  ARDS. EXAM: PORTABLE CHEST 1 VIEW COMPARISON:  04/16/2018. FINDINGS: Endotracheal tube, NG tube, left IJ line stable position. Heart size normal. Bilateral pulmonary infiltrates again noted. Interim improvement from prior exam. No pleural effusion or pneumothorax. IMPRESSION: 1.  Lines and tubes in stable position. 2. Bilateral pulmonary infiltrates again noted. Interim improvement from prior exam. Electronically Signed   By: Maisie Fushomas  Register   On: 04/17/2018 05:34   Dg Chest Port 1 View  Result Date: 04/16/2018 CLINICAL DATA:  Central line placement EXAM: PORTABLE CHEST 1 VIEW COMPARISON:  04/16/2018 FINDINGS: Right subclavian central line, endotracheal tube and NG tube remain in place, unchanged. Interval placement of left internal jugular dialysis catheter with the tip at the cavoatrial junction. No pneumothorax. Heart is normal size. Bilateral airspace opacities could reflect edema or infection, slightly improved since prior study. IMPRESSION: Left dialysis catheter placement with the tip at the cavoatrial junction. No pneumothorax. Bilateral  airspace disease slightly improved, likely improving edema or infection. Electronically Signed   By: Charlett NoseKevin  Dover M.D.   On: 04/16/2018 18:39   Dg Chest Port 1 View  Result Date: 04/16/2018 CLINICAL DATA:  Acute onset of hypoxia. Decreased O2 saturation. EXAM: PORTABLE CHEST 1 VIEW COMPARISON:  Chest radiograph performed 04/15/2018 FINDINGS: The patient's endotracheal tube is seen ending 3 cm above the carina. An enteric tube is noted extending below the diaphragm. A right IJ line is noted ending about the mid SVC. Bilateral septic emboli are again noted. Underlying bibasilar airspace opacification may reflect pulmonary edema or pneumonia. Small bilateral pleural effusions are noted. No pneumothorax is seen. The cardiomediastinal silhouette is normal in size. No acute osseous abnormalities are identified. IMPRESSION: 1. Endotracheal tube seen ending 3 cm above the carina. 2. Bilateral septic emboli again noted. Underlying bibasilar airspace opacification may reflect pulmonary edema or pneumonia. Small bilateral pleural effusions seen. Electronically Signed   By: Roanna RaiderJeffery  Chang M.D.   On: 04/16/2018 01:15   Dg Chest Port 1 View  Result Date: 04/15/2018 CLINICAL DATA:  Droplet precautions. Hx ETT Pt was unresponsive Negative pregnancy test EXAM: PORTABLE CHEST 1 VIEW COMPARISON:  Chest x-ray 10-23-2017, CT of the chest on 04/15/2018 FINDINGS: Endotracheal tube is in place with tip approximately 2.9 centimeters above the carina. Nasogastric tube is in place, tip overlying the level of the stomach. Persistent significant lung opacities, including masslike densities, appear stable. IMPRESSION: 1. Stable appearance of the chest. 2. Endotracheal tube is in place with tip approximately 2.9 centimeters above the carina. Electronically Signed   By: Norva PavlovElizabeth  Brown M.D.   On: 04/15/2018 10:51   Dg Chest Port 1 View  Result Date: 10-23-2017 CLINICAL DATA:  Sepsis.  History of heroin use. EXAM: PORTABLE CHEST 1  VIEW COMPARISON:  10-23-2017 at 1513 hours FINDINGS: The cardiomediastinal silhouette is unchanged with normal heart size. There are persistent confluent nodular/partly masslike opacities in the right lower lung which are similar to the prior study. Diffuse reticulonodular densities are again seen throughout both lungs, and there are Kerley lines noted peripherally in both lungs which are new from the prior study. No pleural effusion or pneumothorax is identified. IMPRESSION: Persistent confluent, nodular opacity in the right lower lung with reticulonodular densities diffusely throughout both lungs. Findings may reflect pneumonia and possibly septic emboli. Superimposed interstitial edema is also possible. Radiographic follow-up recommended to  exclude underlying neoplasm. Electronically Signed   By: Sebastian Ache M.D.   On: 04-18-18 21:59   Vas [place] Vanice Sarah With/wo Tbi  Result Date: 04/17/2018 LOWER EXTREMITY DOPPLER STUDY Indications: Dusky toes.  Performing Technologist: Olen Cordial Rvt  Examination Guidelines: A complete evaluation includes at minimum, Doppler waveform signals and systolic blood pressure reading at the level of bilateral brachial, anterior tibial, and posterior tibial arteries, when vessel segments are accessible. Bilateral testing is considered an integral part of a complete examination. Photoelectric Plethysmograph (PPG) waveforms and toe systolic pressure readings are included as required and additional duplex testing as needed. Limited examinations for reoccurring indications may be performed as noted.  ABI Findings: +---------+------------------+-----+--------+----------------------------------+ Right    Rt Pressure (mmHg)IndexWaveformComment                            +---------+------------------+-----+--------+----------------------------------+ Brachial                                Unable to obtain pressure due to                                           the  presence of an A line          +---------+------------------+-----+--------+----------------------------------+ PTA                                     Unable to obtain a dopplerable                                             signal                             +---------+------------------+-----+--------+----------------------------------+ DP                                      Unable to obtain a dopplerable                                             signal                             +---------+------------------+-----+--------+----------------------------------+ Great Toe                               Unable to obtain a dopplerable                                             signal                             +---------+------------------+-----+--------+----------------------------------+ +---------+-----------------+-----+--------+-----------------------------------+ Left  Lt Pressure      IndexWaveformComment                                      (mmHg)                                                            +---------+-----------------+-----+--------+-----------------------------------+ Brachial 115                   biphasic                                    +---------+-----------------+-----+--------+-----------------------------------+ PTA                                    Unable to obtain a dopplerable                                             signal                              +---------+-----------------+-----+--------+-----------------------------------+ DP                                     Unable to obtain a dopplerable                                             signal                              +---------+-----------------+-----+--------+-----------------------------------+ Great Toe                              Unable to obtain a dopplerable                                             signal                               +---------+-----------------+-----+--------+-----------------------------------+  Summary: Right: Unable to obtain a dopplerable signal in the dorsalis pedis and posterior tibial arteries. Unable to detect great toe signal. Left: Unable to obtain a dopplerable signal in the dorsalis pedis and posterior tibial arteries. Unable to detect great toe signal.  *See table(s) above for measurements and observations.  Electronically signed by Lemar Livings MD on 04/17/2018 at 5:06:08 PM.    Final    [place] Ekg Site Rite  Result Date: 04/17/2018 If Site Rite image not attached, placement could not be confirmed due to  current cardiac rhythm.  [place] Ekg Site Rite  Result Date: 04/15/2018 If Site Rite image not attached, placement could not be confirmed due to current cardiac rhythm.   Microbiology Recent Results (from the past 240 hour(s))  Culture, blood (Routine x 2)     Status: Abnormal   Collection Time: 2018-05-03  2:25 PM  Result Value Ref Range Status   Specimen Description   Final    BLOOD LEFT FOREARM Performed at [facility], [address], [place], [place] [address]    Special Requests   Final    BOTTLES DRAWN AEROBIC AND ANAEROBIC Blood Culture adequate volume Performed at [facility], [address] [place], [place] [address]    Culture  Setup Time   Final    GRAM POSITIVE COCCI Gram Stain Report Called to,Read Back By and Verified With: BRYK V. ANAEROBIC @ 0735 ON [id number] BY HEDNERSON L. CRITICAL RESULT CALLED TO, READ BACK BY AND VERIFIED WITH: M. Roselind Messier, PHARMD AT 1055 ON 04/15/18 BY C. JESSUP, MLT. Performed at [facility], [address] [place], [place] [address]    Culture STAPHYLOCOCCUS AUREUS (A)  Final   Report Status 04/17/2018 FINAL  Final   Organism ID, Bacteria STAPHYLOCOCCUS AUREUS  Final      Susceptibility   Staphylococcus aureus - MIC*    CIPROFLOXACIN <=0.5 SENSITIVE Sensitive     ERYTHROMYCIN <=0.25 SENSITIVE Sensitive      GENTAMICIN <=0.5 SENSITIVE Sensitive     OXACILLIN <=0.25 SENSITIVE Sensitive     TETRACYCLINE <=1 SENSITIVE Sensitive     VANCOMYCIN 1 SENSITIVE Sensitive     TRIMETH/SULFA <=10 SENSITIVE Sensitive     CLINDAMYCIN <=0.25 SENSITIVE Sensitive     RIFAMPIN <=0.5 SENSITIVE Sensitive     Inducible Clindamycin NEGATIVE Sensitive     * STAPHYLOCOCCUS AUREUS  Culture, blood (Routine x 2)     Status: Abnormal   Collection Time: 05-03-2018  2:25 PM  Result Value Ref Range Status   Specimen Description   Final    BLOOD LEFT FOREARM Performed at [facility], [address], [place], [place] [address]    Special Requests   Final    BOTTLES DRAWN AEROBIC AND ANAEROBIC Blood Culture adequate volume Performed at [facility], [address] [place], [place] [address]    Culture  Setup Time   Final    GRAM POSITIVE COCCI Gram Stain Report Called to,Read Back By and Verified With: BRYK V. ANAEROBIC @ 0735 ON [id number] HENDERSON L. CRITICAL RESULT CALLED TO, READ BACK BY AND VERIFIED WITH: M. Roselind Messier, PHARMD AT 1055 ON 04/15/18 BY C. JESSUP, MLT.    Culture (A)  Final    STAPHYLOCOCCUS AUREUS SUSCEPTIBILITIES PERFORMED ON PREVIOUS CULTURE WITHIN THE LAST 5 DAYS. Performed at [facility], [address], [place], [place] [address]    Report Status 04/17/2018 FINAL  Final  Blood Culture ID Panel (Reflexed)     Status: Abnormal   Collection Time: 03-May-2018  2:25 PM  Result Value Ref Range Status   Enterococcus species NOT DETECTED NOT DETECTED Final   Listeria monocytogenes NOT DETECTED NOT DETECTED Final   Staphylococcus species DETECTED (A) NOT DETECTED Final    Comment: CRITICAL RESULT CALLED TO, READ BACK BY AND VERIFIED WITH: M. Roselind Messier, PHARMD AT 1055 ON 04/15/18 BY C. JESSUP, MLT.    Staphylococcus aureus (BCID) DETECTED (A) NOT DETECTED Final    Comment: Methicillin (oxacillin) susceptible Staphylococcus aureus (MSSA). Preferred therapy is anti staphylococcal beta  lactam antibiotic  (Cefazolin or Nafcillin), unless clinically contraindicated. CRITICAL RESULT CALLED TO, READ BACK BY AND VERIFIED WITH: M. Roselind Messier, PHARMD AT 1055 ON 04/15/18 BY C. JESSUP, MLT.    Methicillin resistance NOT DETECTED NOT DETECTED Final   Streptococcus species NOT DETECTED NOT DETECTED Final   Streptococcus agalactiae NOT DETECTED NOT DETECTED Final   Streptococcus pneumoniae NOT DETECTED NOT DETECTED Final   Streptococcus pyogenes NOT DETECTED NOT DETECTED Final   Acinetobacter baumannii NOT DETECTED NOT DETECTED Final   Enterobacteriaceae species NOT DETECTED NOT DETECTED Final   Enterobacter cloacae complex NOT DETECTED NOT DETECTED Final   Escherichia coli NOT DETECTED NOT DETECTED Final   Klebsiella oxytoca NOT DETECTED NOT DETECTED Final   Klebsiella pneumoniae NOT DETECTED NOT DETECTED Final   Proteus species NOT DETECTED NOT DETECTED Final   Serratia marcescens NOT DETECTED NOT DETECTED Final   Haemophilus influenzae NOT DETECTED NOT DETECTED Final   Neisseria meningitidis NOT DETECTED NOT DETECTED Final   Pseudomonas aeruginosa NOT DETECTED NOT DETECTED Final   Candida albicans NOT DETECTED NOT DETECTED Final   Candida glabrata NOT DETECTED NOT DETECTED Final   Candida krusei NOT DETECTED NOT DETECTED Final   Candida parapsilosis NOT DETECTED NOT DETECTED Final   Candida tropicalis NOT DETECTED NOT DETECTED Final    Comment: Performed at [facility], [address], [place], [place] [address]  Urine culture     Status: Abnormal   Collection Time: 04/05/2018  8:54 PM  Result Value Ref Range Status   Specimen Description URINE, CATHETERIZED  Final   Special Requests   Final    NONE Performed at [facility], [address], [place], [place] [address]    Culture (A)  Final    >=100,000 COLONIES/mL ESCHERICHIA COLI 70,000 COLONIES/mL STAPHYLOCOCCUS AUREUS    Report Status 04/18/2018 FINAL  Final   Organism ID, Bacteria ESCHERICHIA COLI (A)  Final   Organism ID,  Bacteria STAPHYLOCOCCUS AUREUS (A)  Final      Susceptibility   Escherichia coli - MIC*    AMPICILLIN >=32 RESISTANT Resistant     CEFAZOLIN 16 SENSITIVE Sensitive     CEFTRIAXONE <=1 SENSITIVE Sensitive     CIPROFLOXACIN 0.5 SENSITIVE Sensitive     GENTAMICIN <=1 SENSITIVE Sensitive     IMIPENEM <=0.25 SENSITIVE Sensitive     NITROFURANTOIN <=16 SENSITIVE Sensitive     TRIMETH/SULFA >=320 RESISTANT Resistant     AMPICILLIN/SULBACTAM >=32 RESISTANT Resistant     PIP/TAZO 64 INTERMEDIATE Intermediate     Extended ESBL NEGATIVE Sensitive     * >=100,000 COLONIES/mL ESCHERICHIA COLI   Staphylococcus aureus - MIC*    CIPROFLOXACIN <=0.5 SENSITIVE Sensitive     GENTAMICIN <=0.5 SENSITIVE Sensitive     NITROFURANTOIN 32 SENSITIVE Sensitive     OXACILLIN 0.5 SENSITIVE Sensitive     TETRACYCLINE <=1 SENSITIVE Sensitive     VANCOMYCIN 1 SENSITIVE Sensitive     TRIMETH/SULFA <=10 SENSITIVE Sensitive     CLINDAMYCIN <=0.25 SENSITIVE Sensitive     RIFAMPIN <=0.5 SENSITIVE Sensitive     Inducible Clindamycin NEGATIVE Sensitive     * 70,000 COLONIES/mL STAPHYLOCOCCUS AUREUS  MRSA PCR Screening     Status: None   Collection Time: 03/24/2018  8:57 PM  Result Value Ref Range Status   MRSA by PCR NEGATIVE NEGATIVE Final    Comment:        The GeneXpert MRSA Assay (FDA approved for NASAL specimens only), is one component of  a comprehensive MRSA colonization surveillance program. It is not intended to diagnose MRSA infection nor to guide or monitor treatment for MRSA infections. Performed at [facility], [address] [place], [place] [address]   Respiratory Panel by PCR     Status: None   Collection Time: 03/20/2018  9:03 PM  Result Value Ref Range Status   Adenovirus NOT DETECTED NOT DETECTED Final   Coronavirus 229E NOT DETECTED NOT DETECTED Final   Coronavirus HKU1 NOT DETECTED NOT DETECTED Final   Coronavirus NL63 NOT DETECTED NOT DETECTED Final   Coronavirus OC43 NOT DETECTED  NOT DETECTED Final   Metapneumovirus NOT DETECTED NOT DETECTED Final   Rhinovirus / Enterovirus NOT DETECTED NOT DETECTED Final   Influenza A NOT DETECTED NOT DETECTED Final   Influenza B NOT DETECTED NOT DETECTED Final   Parainfluenza Virus 1 NOT DETECTED NOT DETECTED Final   Parainfluenza Virus 2 NOT DETECTED NOT DETECTED Final   Parainfluenza Virus 3 NOT DETECTED NOT DETECTED Final   Parainfluenza Virus 4 NOT DETECTED NOT DETECTED Final   Respiratory Syncytial Virus NOT DETECTED NOT DETECTED Final   Bordetella pertussis NOT DETECTED NOT DETECTED Final   Chlamydophila pneumoniae NOT DETECTED NOT DETECTED Final   Mycoplasma pneumoniae NOT DETECTED NOT DETECTED Final  Culture, blood (routine x 2)     Status: None   Collection Time: 03/29/2018 11:30 PM  Result Value Ref Range Status   Specimen Description BLOOD RIGHT ARM  Final   Special Requests   Final    BOTTLES DRAWN AEROBIC ONLY Blood Culture results may not be optimal due to an inadequate volume of blood received in culture bottles   Culture   Final    NO GROWTH 5 DAYS Performed at [facility], [address] [place], [place] [address]    Report Status 04/20/2018 FINAL  Final  Culture, blood (routine x 2)     Status: None   Collection Time: 04/11/2018 11:33 PM  Result Value Ref Range Status   Specimen Description BLOOD RIGHT HAND  Final   Special Requests   Final    BOTTLES DRAWN AEROBIC ONLY Blood Culture adequate volume   Culture   Final    NO GROWTH 5 DAYS Performed at [facility], [address] [place], [place] [address]    Report Status 04/20/2018 FINAL  Final  Culture, blood (Routine X 2) w Reflex to ID Panel     Status: None   Collection Time: 04/16/18  6:38 AM  Result Value Ref Range Status   Specimen Description BLOOD LEFT ANTECUBITAL  Final   Special Requests AEROBIC BOTTLE ONLY Blood Culture adequate volume  Final   Culture NO GROWTH 5 DAYS  Final   Report Status 04/21/2018 FINAL  Final  Culture,  blood (Routine X 2) w Reflex to ID Panel     Status: None   Collection Time: 04/16/18  6:45 AM  Result Value Ref Range Status   Specimen Description BLOOD BLOOD RIGHT HAND  Final   Special Requests AEROBIC BOTTLE ONLY Blood Culture adequate volume  Final   Culture NO GROWTH 5 DAYS  Final   Report Status 04/21/2018 FINAL  Final    Lab Basic Metabolic Panel: Recent Labs  Lab 04/17/18 1524 04/18/18 0419 04/18/18 1600 05/06/18 0531  NA 136 135 137 135  K 5.5* 5.2* 5.1 4.8  CL 103 102 104 103  CO2 13* 19* 22 24  GLUCOSE 137* 113* 143* 104*  BUN 40* 39* 37* 29*  CREATININE 1.39* 1.44* 1.13* 1.05*  CALCIUM 7.2* 7.6* 7.6* 7.7*  MG  --  2.7*  --  2.6*  PHOS 7.4* 6.6* 6.3* 3.1   Liver Function Tests: Recent Labs  Lab 04/17/18 1524 04/18/18 0419 04/18/18 1600 05/18/2018 0531  AST  --  4,900*  --   --   ALT  --  627*  --   --   ALKPHOS  --  500*  --   --   BILITOT  --  21.0*  --   --   PROT  --  6.0*  --   --   ALBUMIN 1.9* 2.2* 2.3* 2.0*   No results for input(s): LIPASE, AMYLASE in the last 168 hours. Recent Labs  Lab 04/18/18 0642  AMMONIA 175*   CBC: Recent Labs  Lab 04/17/18 1109 04/17/18 2331 04/18/18 0419 04/18/18 1330 04/18/18 1804 04/18/2018 0531  WBC 63.4* 49.4* 43.2* 40.1* 36.6* 28.5*  NEUTROABS 48.8* 43.0* 39.7* 36.5*  --  23.8*  HGB 8.4* 6.6* 7.1* 6.1* 7.9* 7.6*  HCT 26.3* 21.1* 22.8* 19.3* 24.4* 23.1*  MCV 95.3 93.8 89.1 88.1 87.8 84.6  PLT 9*  8* 9* 8* 67* 44* 30*   Cardiac Enzymes: No results for input(s): CKTOTAL, CKMB, CKMBINDEX, TROPONINI in the last 168 hours. Sepsis Labs: Recent Labs  Lab 04/17/18 1102  04/18/18 0419 04/18/18 1330 04/18/18 1804 04/28/2018 0531  WBC  --    < > 43.2* 40.1* 36.6* 28.5*  LATICACIDVEN 11.3*  --   --   --   --   --    < > = values in this interval not displayed.    Procedures/Operations  CVVHD Intubation, mechanical ventilation  Max Fickle 04/24/2018, 8:04 AM

## 2018-05-19 NOTE — Progress Notes (Signed)
Patient passed away with mother, sister, and aunt at bedside. Funeral arrangement card given to family, will get ready for the morgue. Huntley Estelle E, RN 05/02/2018 1248

## 2018-05-19 NOTE — Progress Notes (Signed)
Wasted 120 of versed and 150 of fentanyl in sink with [employer]. Huntley Estelle E, RN 04/18/2018 7:06 AM

## 2018-05-19 NOTE — Progress Notes (Signed)
[facility] Progress Note  Name: Frances Le MRN: [id number] DOB: 29-Aug-1985  Subjective:  Plan to withdraw care today when family arrives - has not been responsive despite holding sedation.    Review of systems:  Unable to obtain 2/2 mech ventilation, sedated --------------- Background:  Frances Le is a 33 y.o. female with a history of IV drug use and bipolar disorder who presented [facility] the hospital from jail with AMS.  The patient was found to have MSSA bacteremia and has been in septic shock and hypotensive requiring 3 pressors.  Dopamine stopped this AM due to tachycardia.  Of note, the patient also has been worked up for thrombocytopenia concerning for DIC or possible TTP.  TTP has been felt less likely per team.  Work-up notable for hepatitis C ab positive.  HIV non-reactive.  Urine pregnancy negative.  UA negative for protein and 0-5 RBC.  Consents have been obtained on emergent basis.  She has been incarcerated.      Intake/Output Summary (Last 24 hours) at 04-26-2018 0856 Last data filed at 04-26-18 0800 Gross per 24 hour  Intake 2402.57 ml  Output 4253 ml  Net -1850.43 ml    Vitals:  Vitals:   2018/04/26 0700 04/26/2018 0722 04-26-18 0751 Apr 26, 2018 0800  BP:   (!) 95/42   Pulse: (!) 108 (!) 111 (!) 106 (!) 105  Resp: (!) 30 (!) 30 (!) 30 (!) 30  Temp: 99.7 F (37.6 C)   99.3 F (37.4 C)  TempSrc:      SpO2: 99% 98% 99% 99%  Weight:      Height:         Physical Exam:  General adult female in bed critically ill and intubated HEENT normocephalic atraumatic; ET tube in place with minimal oral mucosa bleeding Lungs coarse mechanical breath sounds Heart tachycardia  Abdomen soft nondistended; active bowel sounds Extremities 1+ edema  Neuro - intubated, sedated   Medications reviewed   Labs:  BMP Latest Ref Rng & Units 26-Apr-2018 04/18/2018 04/18/2018  Glucose 70 - 99 mg/dL 262(M) 355(H) 741(U)  BUN 6 - 20 mg/dL 38(G) 53(M) 46(O)  Creatinine 0.44 -  1.00 mg/dL 0.32(Z) 2.24(M) 2.50(I)  Sodium 135 - 145 mmol/L 135 137 135  Potassium 3.5 - 5.1 mmol/L 4.8 5.1 5.2(H)  Chloride 98 - 111 mmol/L 103 104 102  CO2 22 - 32 mmol/L 24 22 19(L)  Calcium 8.9 - 10.3 mg/dL 7.7(L) 7.6(L) 7.6(L)     Assessment/Plan:   # AKI  - Secondary to ischemic ATN as well as possible emboli in setting of MSSA bacteremia  - Continued CVVHD overnight via non-tunneled line as supportive care but will discontinue as patient will transition to comfort care when family arrives today - Set fluid removal goal as keep even with ARDS.    # Metabolic acidosis  -Improved with CRRT  # Septic shock secondary to MSSA bacteremia  - on cefazolin. Note complicated by diffuse pulmonary emboli   # Acute hypoxic and hypercapneic respiratory failure  - on mechanical ventilation per pulm  # Thrombocytopenia: studies consistent with DIC in setting of severe infection.   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

## 2018-05-19 DEATH — deceased

## 2020-01-18 IMAGING — CT CT ABD-PELV W/O CM
2 of 4 series · 11 of 36 positions shown, 13 images · non-contrast
Comparison: CT of the abdomen and pelvis performed 11/26/2017, and
chest radiograph performed 04/14/2018

CLINICAL DATA: Acute onset of sepsis.

EXAM:
CT CHEST, ABDOMEN AND PELVIS WITHOUT CONTRAST
TECHNIQUE: Multidetector CT imaging of the chest, abdomen and pelvis was
performed following the standard protocol without IV contrast.

[Series 3: cap wo 5.0 i31f 2 · axial · 0.78mm/px · z∈[+833,+1338]mm · 8 of 125 slices shown, 10 images]
[im 12/125  mediastinal]
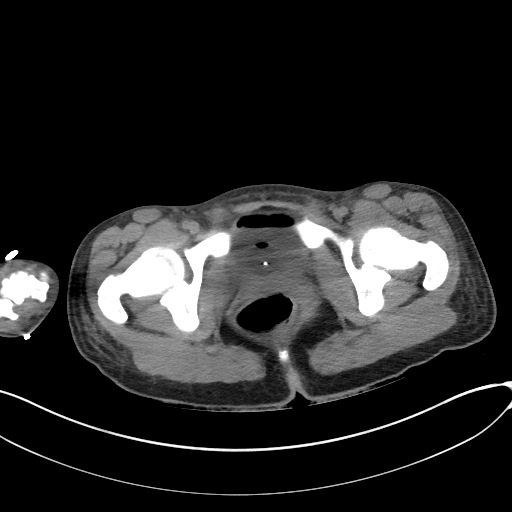
[im 12/125  lung]
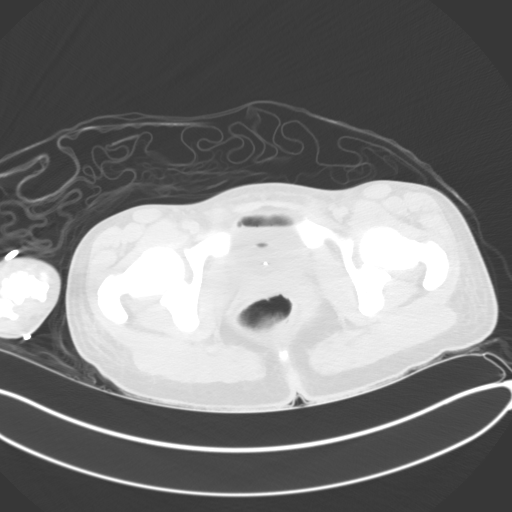
[im 23/125  lung]
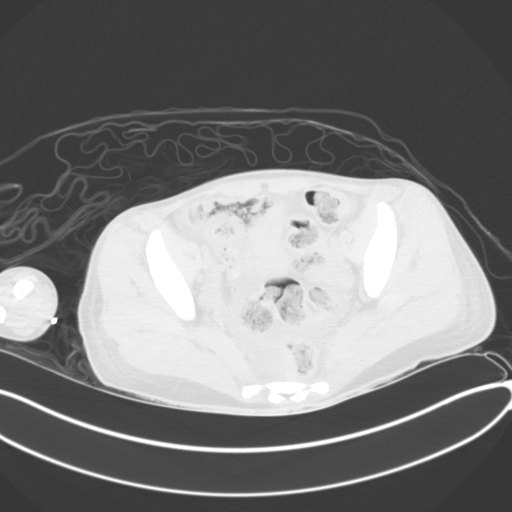
[im 46/125  lung]
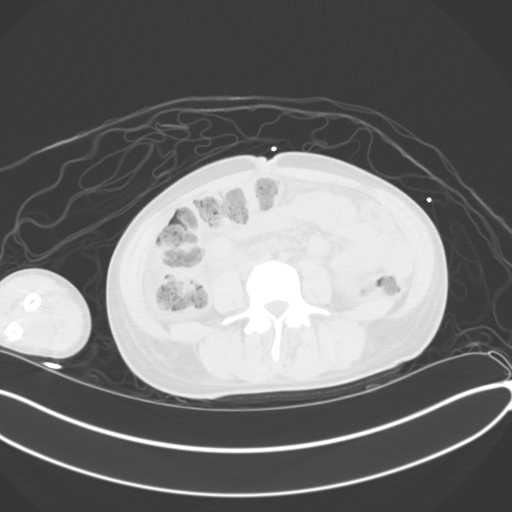
[im 57/125  lung]
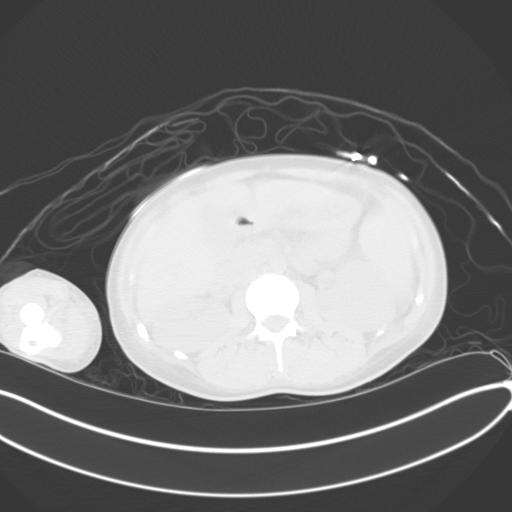
[im 68/125  mediastinal]
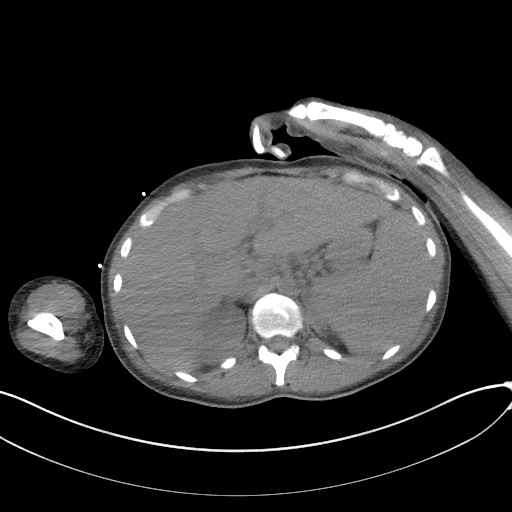
[im 68/125  lung]
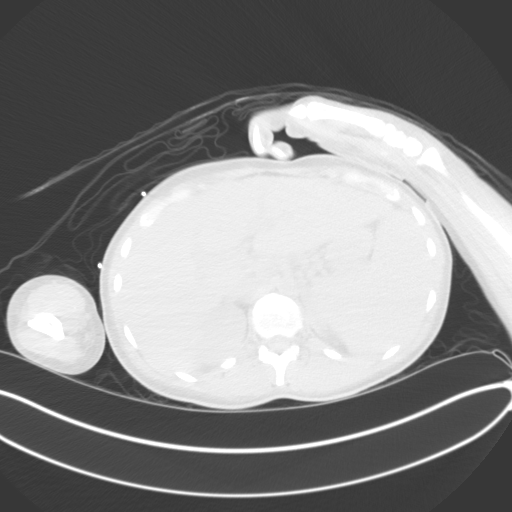
[im 79/125  lung]
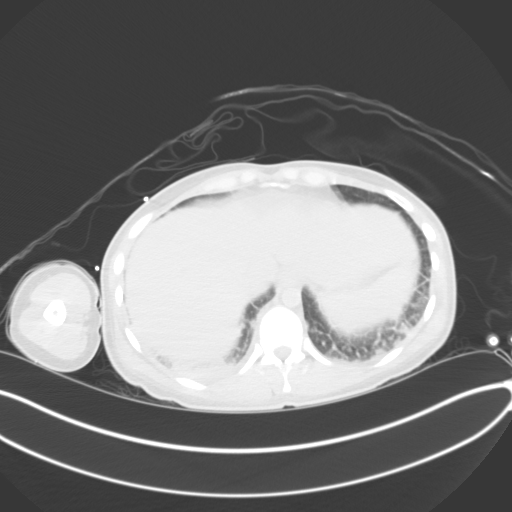
[im 102/125  lung]
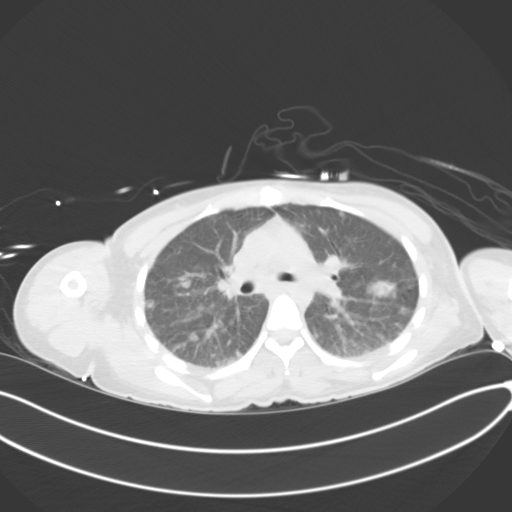
[im 113/125  lung]
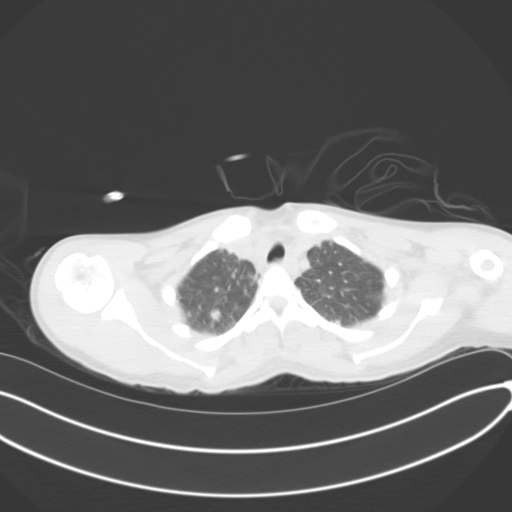

[Series 6: coronal · coronal · 0.62mm/px · 3 of 112 slices shown]
[im 23/112  lung]
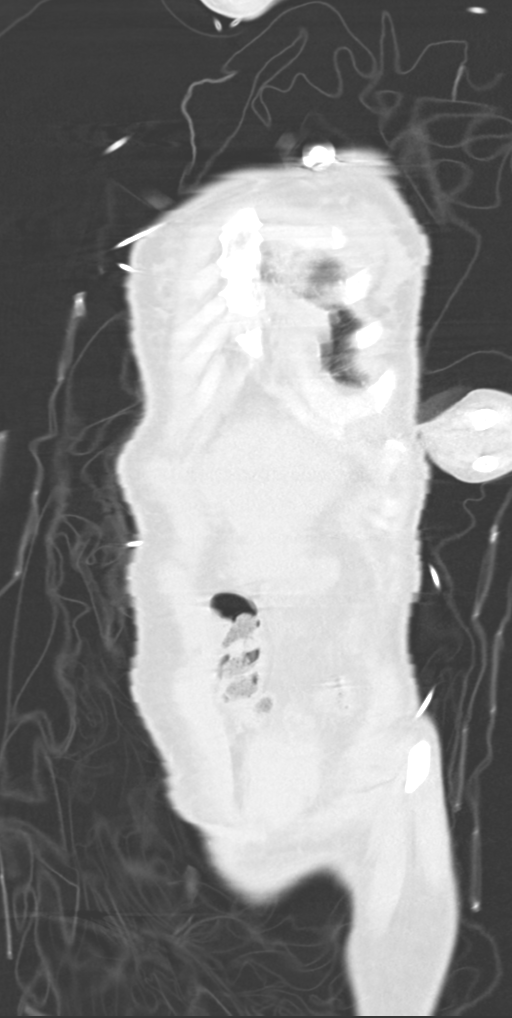
[im 45/112  lung]
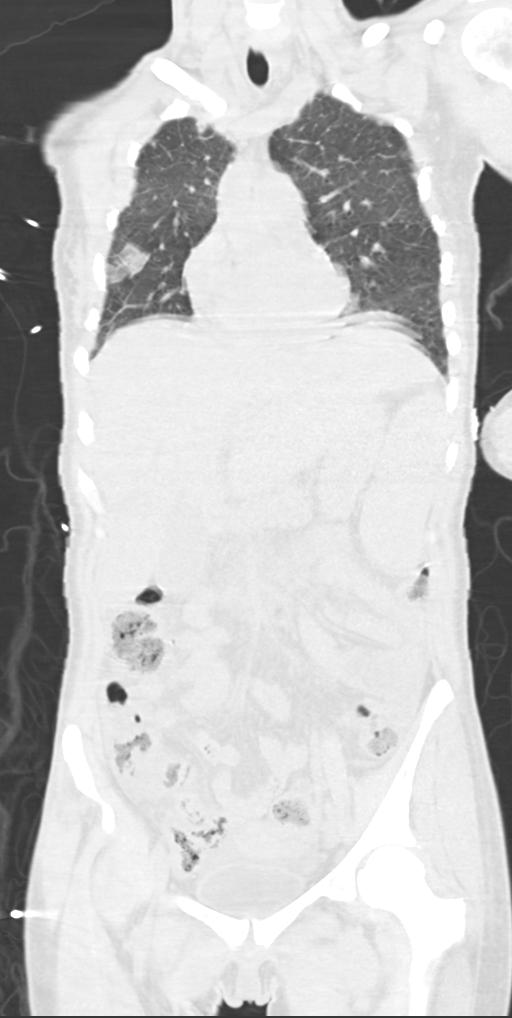
[im 67/112  lung]
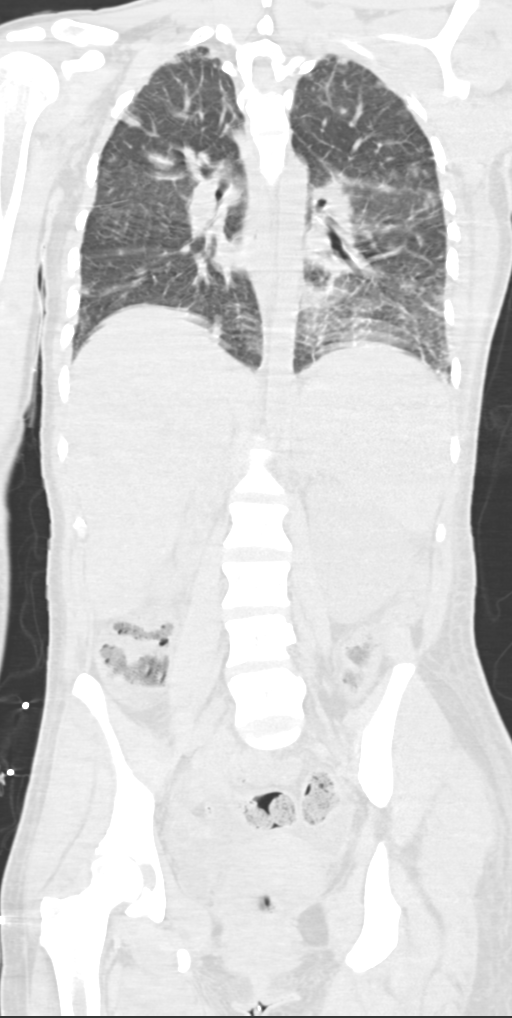

[11 of 36 positions shown; findings below may reference images not displayed]

FINDINGS: CT CHEST FINDINGS

Cardiovascular: The heart is normal in size. The thoracic aorta is
grossly unremarkable, though difficult to fully assess without
contrast. The great vessels are unremarkable in appearance.

Mediastinum/Nodes: The mediastinum is unremarkable in appearance. No
definite mediastinal lymphadenopathy is seen. No pericardial
effusion is identified. The thyroid gland is grossly unremarkable.
No axillary lymphadenopathy is appreciated.

Lungs/Pleura: Scattered nodular airspace opacities are noted
bilaterally, with larger airspace opacities on the right, measuring
up to 5.6 cm in size. This is suspicious for septic emboli.
Underlying diffuse interstitial prominence is noted, with trace
bilateral pleural fluid, concerning for mild pulmonary edema. No
pneumothorax is seen.

Musculoskeletal: No acute osseous abnormalities are identified. The
visualized musculature is unremarkable in appearance.

CT ABDOMEN PELVIS FINDINGS

Hepatobiliary: The liver is unremarkable in appearance. The
gallbladder is unremarkable in appearance. The common bile duct
remains normal in caliber.

Pancreas: The pancreas is within normal limits.

Spleen: The spleen is unremarkable in appearance.

Adrenals/Urinary Tract: The adrenal glands are unremarkable in
appearance.

There is question of haziness about the left renal pelvis, and mild
left-sided perinephric stranding is seen. There is no evidence of
hydronephrosis. No renal or ureteral stones are identified.

Stomach/Bowel: The stomach is unremarkable in appearance. The small
bowel is within normal limits. The appendix is normal in caliber,
without evidence of appendicitis.

There is question of mild wall thickening at the cecum and proximal
ascending colon, which could reflect a mild infectious or
inflammatory process..

Mild diffuse mesenteric edema is noted.

Vascular/Lymphatic: The abdominal aorta is grossly unremarkable,
though not well assessed without contrast. No definite focal venous
abnormalities are characterized, though the venous structures are
also not well assessed.

No definite retroperitoneal or pelvic sidewall lymphadenopathy is
seen.

Reproductive: The bladder is mildly distended, and contains air,
secondary to Foley catheter placement. The uterus is grossly
unremarkable in appearance. No suspicious adnexal masses are seen.

Other: A small amount of ascites is seen within the pelvis.

Musculoskeletal: No acute osseous abnormalities are identified. The
visualized musculature is unremarkable in appearance.
IMPRESSION: 1. Scattered nodular airspace opacities bilaterally, with larger
airspace opacities on the right, measuring up to 5.6 cm in size.
This is suspicious for diffuse septic emboli.
2. Underlying diffuse interstitial prominence, with trace bilateral
pleural fluid, concerning for mild pulmonary edema.
3. Question of mild wall thickening at the cecum and proximal
ascending colon, which could reflect a mild infectious or
inflammatory process.
4. Mild diffuse mesenteric edema noted.
5. Question of haziness about the left renal pelvis, and mild
left-sided perinephric stranding. Would correlate for any evidence
of left-sided pyelonephritis.
6. Small amount of ascites within the pelvis.

## 2020-01-19 IMAGING — DX DG CHEST 1V PORT
1 series · 1 of 1 positions shown · non-contrast
Comparison: 04/16/2018

CLINICAL DATA: Central line placement

EXAM:
PORTABLE CHEST 1 VIEW

[chest ap]
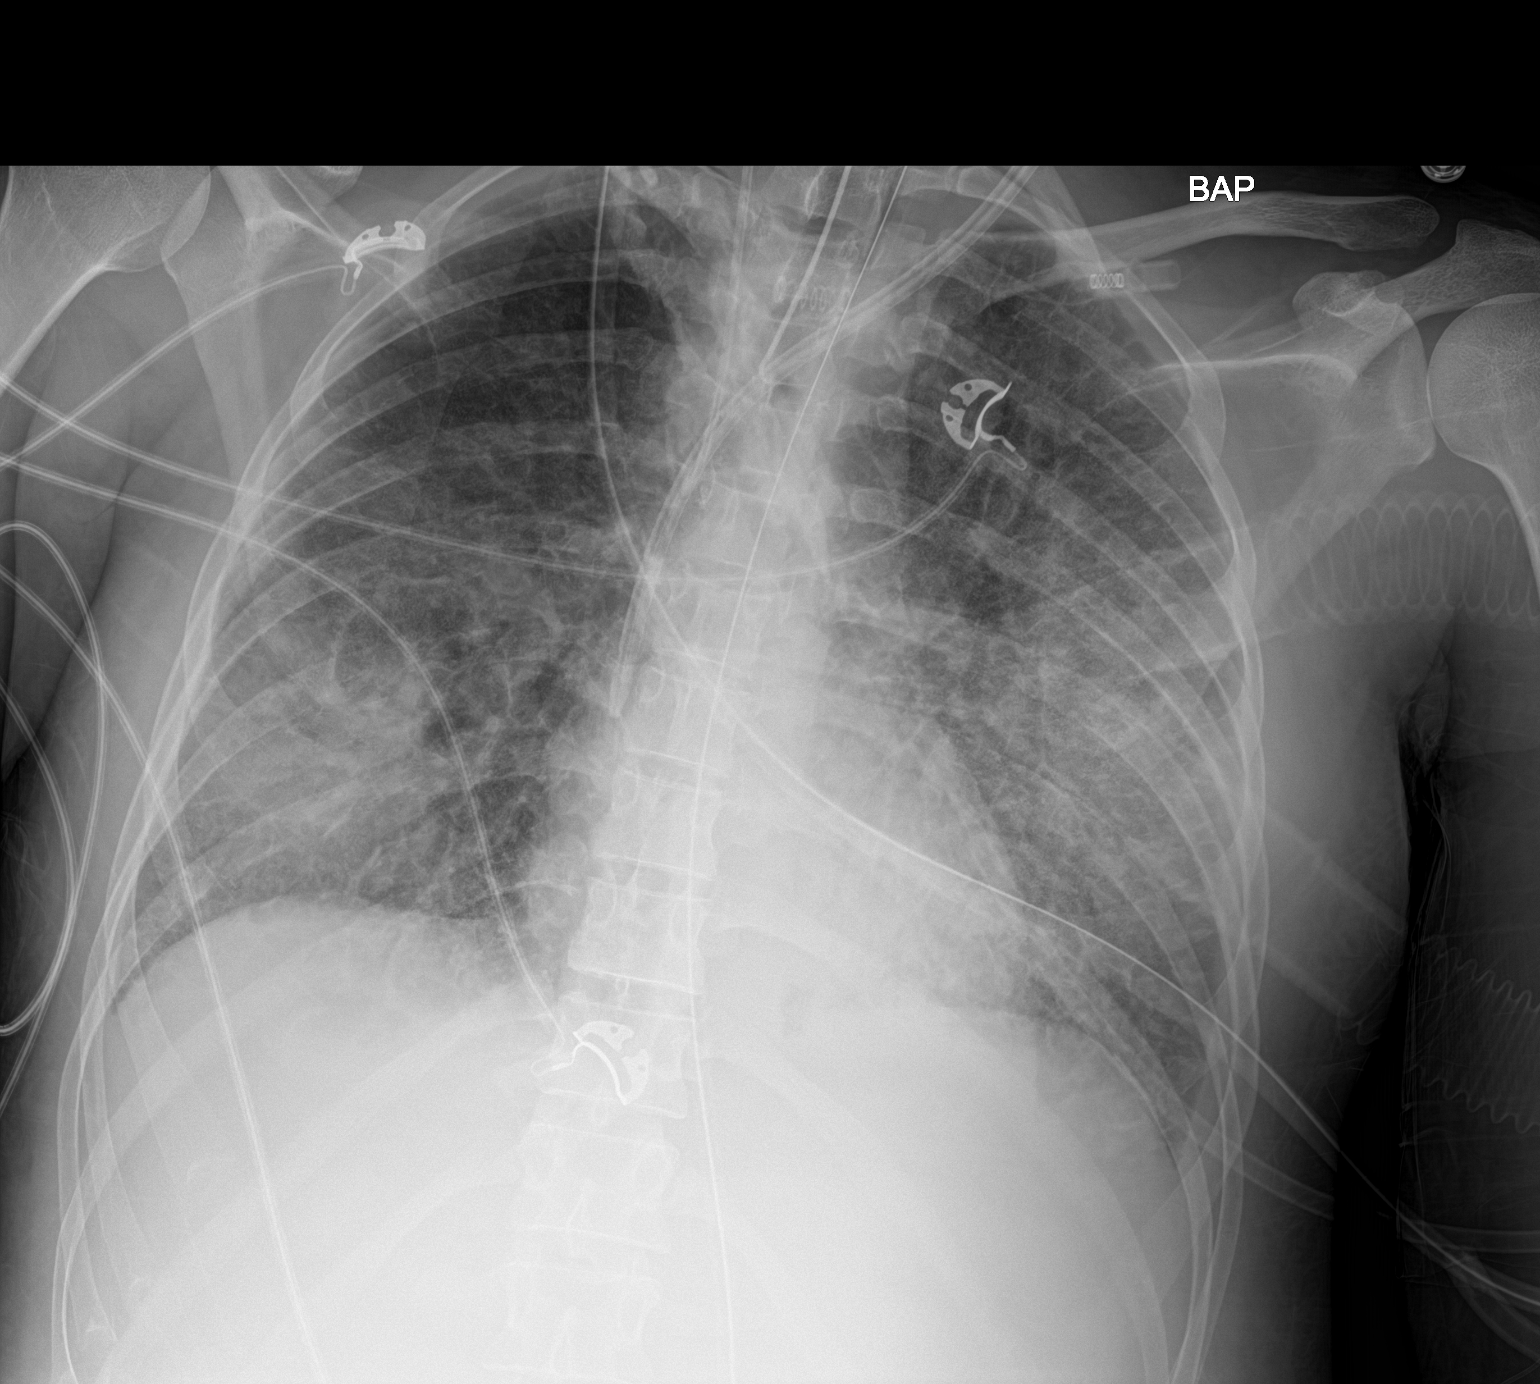

[1 of 1 positions shown; findings below may reference images not displayed]

FINDINGS: Right subclavian central line, endotracheal tube and NG tube remain
in place, unchanged. Interval placement of left internal jugular
dialysis catheter with the tip at the cavoatrial junction. No
pneumothorax. Heart is normal size. Bilateral airspace opacities
could reflect edema or infection, slightly improved since prior
study.
IMPRESSION: Left dialysis catheter placement with the tip at the cavoatrial
junction. No pneumothorax.

Bilateral airspace disease slightly improved, likely improving edema
or infection.
